# Patient Record
Sex: Male | Born: 1945 | Race: White | Hispanic: No | Marital: Married | Smoking: Former smoker
Health system: Southern US, Community
[De-identification: ages and names within clinical notes are randomized; demographics above are authoritative.]

## PROBLEM LIST (undated history)

## (undated) DIAGNOSIS — Z923 Personal history of irradiation: Secondary | ICD-10-CM

## (undated) DIAGNOSIS — G459 Transient cerebral ischemic attack, unspecified: Secondary | ICD-10-CM

## (undated) DIAGNOSIS — G709 Myoneural disorder, unspecified: Secondary | ICD-10-CM

## (undated) DIAGNOSIS — I493 Ventricular premature depolarization: Secondary | ICD-10-CM

## (undated) DIAGNOSIS — Z531 Procedure and treatment not carried out because of patient's decision for reasons of belief and group pressure: Secondary | ICD-10-CM

## (undated) DIAGNOSIS — K219 Gastro-esophageal reflux disease without esophagitis: Secondary | ICD-10-CM

## (undated) DIAGNOSIS — E785 Hyperlipidemia, unspecified: Secondary | ICD-10-CM

## (undated) DIAGNOSIS — F419 Anxiety disorder, unspecified: Secondary | ICD-10-CM

## (undated) DIAGNOSIS — IMO0001 Reserved for inherently not codable concepts without codable children: Secondary | ICD-10-CM

## (undated) DIAGNOSIS — I639 Cerebral infarction, unspecified: Secondary | ICD-10-CM

## (undated) DIAGNOSIS — R634 Abnormal weight loss: Secondary | ICD-10-CM

## (undated) DIAGNOSIS — J45909 Unspecified asthma, uncomplicated: Secondary | ICD-10-CM

## (undated) DIAGNOSIS — C719 Malignant neoplasm of brain, unspecified: Secondary | ICD-10-CM

## (undated) DIAGNOSIS — R569 Unspecified convulsions: Secondary | ICD-10-CM

## (undated) DIAGNOSIS — I82409 Acute embolism and thrombosis of unspecified deep veins of unspecified lower extremity: Secondary | ICD-10-CM

## (undated) DIAGNOSIS — Z0389 Encounter for observation for other suspected diseases and conditions ruled out: Secondary | ICD-10-CM

## (undated) DIAGNOSIS — I471 Supraventricular tachycardia, unspecified: Secondary | ICD-10-CM

## (undated) DIAGNOSIS — T7840XA Allergy, unspecified, initial encounter: Secondary | ICD-10-CM

## (undated) DIAGNOSIS — E079 Disorder of thyroid, unspecified: Secondary | ICD-10-CM

## (undated) DIAGNOSIS — I10 Essential (primary) hypertension: Secondary | ICD-10-CM

## (undated) HISTORY — DX: Unspecified asthma, uncomplicated: J45.909

## (undated) HISTORY — DX: Transient cerebral ischemic attack, unspecified: G45.9

## (undated) HISTORY — DX: Supraventricular tachycardia, unspecified: I47.10

## (undated) HISTORY — PX: THYROIDECTOMY, PARTIAL: SHX18

## (undated) HISTORY — PX: SHOULDER SURGERY: SHX246

## (undated) HISTORY — DX: Disorder of thyroid, unspecified: E07.9

## (undated) HISTORY — DX: Gastro-esophageal reflux disease without esophagitis: K21.9

## (undated) HISTORY — DX: Ventricular premature depolarization: I49.3

## (undated) HISTORY — DX: Acute embolism and thrombosis of unspecified deep veins of unspecified lower extremity: I82.409

## (undated) HISTORY — DX: Allergy, unspecified, initial encounter: T78.40XA

## (undated) HISTORY — DX: Hyperlipidemia, unspecified: E78.5

## (undated) HISTORY — DX: Abnormal weight loss: R63.4

## (undated) HISTORY — DX: Encounter for observation for other suspected diseases and conditions ruled out: Z03.89

## (undated) HISTORY — DX: Supraventricular tachycardia: I47.1

## (undated) HISTORY — DX: Essential (primary) hypertension: I10

---

## 1998-07-12 LAB — HM COLONOSCOPY

## 2000-07-12 DIAGNOSIS — I639 Cerebral infarction, unspecified: Secondary | ICD-10-CM

## 2000-07-12 HISTORY — DX: Cerebral infarction, unspecified: I63.9

## 2002-04-24 ENCOUNTER — Encounter: Admission: RE | Admit: 2002-04-24 | Discharge: 2002-04-24 | Payer: Self-pay

## 2003-06-27 ENCOUNTER — Encounter: Admission: RE | Admit: 2003-06-27 | Discharge: 2003-06-27 | Payer: Self-pay | Admitting: Otolaryngology

## 2006-01-06 ENCOUNTER — Emergency Department: Payer: Self-pay | Admitting: Unknown Physician Specialty

## 2008-07-12 LAB — HM COLONOSCOPY

## 2010-02-02 ENCOUNTER — Emergency Department: Payer: Self-pay | Admitting: Emergency Medicine

## 2010-04-14 ENCOUNTER — Ambulatory Visit: Payer: Self-pay | Admitting: Otolaryngology

## 2012-01-05 ENCOUNTER — Ambulatory Visit: Payer: Self-pay | Admitting: Endocrinology

## 2012-02-04 ENCOUNTER — Encounter: Payer: Self-pay | Admitting: Endocrinology

## 2012-02-04 ENCOUNTER — Ambulatory Visit (INDEPENDENT_AMBULATORY_CARE_PROVIDER_SITE_OTHER): Payer: Medicare Other | Admitting: Endocrinology

## 2012-02-04 ENCOUNTER — Other Ambulatory Visit (INDEPENDENT_AMBULATORY_CARE_PROVIDER_SITE_OTHER): Payer: Medicare Other

## 2012-02-04 VITALS — BP 110/72 | HR 69 | Temp 97.9°F | Ht 75.0 in | Wt 241.0 lb

## 2012-02-04 DIAGNOSIS — I10 Essential (primary) hypertension: Secondary | ICD-10-CM | POA: Insufficient documentation

## 2012-02-04 DIAGNOSIS — J45909 Unspecified asthma, uncomplicated: Secondary | ICD-10-CM | POA: Insufficient documentation

## 2012-02-04 DIAGNOSIS — E079 Disorder of thyroid, unspecified: Secondary | ICD-10-CM | POA: Insufficient documentation

## 2012-02-04 DIAGNOSIS — K219 Gastro-esophageal reflux disease without esophagitis: Secondary | ICD-10-CM | POA: Insufficient documentation

## 2012-02-04 LAB — TSH: TSH: 0.69 u[IU]/mL (ref 0.35–5.50)

## 2012-02-04 LAB — T4, FREE: Free T4: 0.79 ng/dL (ref 0.60–1.60)

## 2012-02-04 NOTE — Patient Instructions (Addendum)
blood tests are being requested for you today.  We'll call you with results.

## 2012-02-04 NOTE — Progress Notes (Signed)
Subjective:    Patient ID: Ricky Montgomery, male    DOB: 1946/05/14, 66 y.o.   MRN: [id number]  HPI Pt state few mos of slight headache throughout the head, and assoc severe fatigue.  He says he was found to have a severely underactive thyroid on labs.   Past Medical History  Diagnosis Date  . Asthma   . GERD (gastroesophageal reflux disease)   . Allergy   . PVC (premature ventricular contraction)   . Hypertension   . Thyroid disease    No past surgical history on file.  History   Social History  . Marital Status: Married    Spouse Name: N/A    Number of Children: N/A  . Years of Education: 11   Occupational History  . Retired     Music therapist   Social History Main Topics  . Smoking status: Former Smoker -- 0.5 packs/day for 5 years    Types: Cigarettes  . Smokeless tobacco: Never Used  . Alcohol Use: No  . Drug Use: No  . Sexually Active: Not on file   Other Topics Concern  . Not on file   Social History Narrative   Regular exercise-yesCaffeine Use-yes    Current Outpatient Prescriptions on File Prior to Visit  Medication Sig Dispense Refill  . hydrochlorothiazide (HYDRODIURIL) 25 MG tablet Take 25 mg by mouth daily.       [name] Kitchen losartan (COZAAR) 50 MG tablet Take 50 mg by mouth daily.       . metoprolol succinate (TOPROL-XL) 50 MG 24 hr tablet Take 50 mg by mouth daily.        No Known Allergies  Family History  Problem Relation Age of Onset  . Alcohol abuse Other     Grandparent  no thyroid probs. BP 110/72  Pulse 69  Temp 97.9 F (36.6 C) (Oral)  Ht 6\' 3"  (1.905 m)  Wt 241 lb (109.317 kg)  BMI 30.12 kg/m2  SpO2 97%  Review of Systems denies polyuria, depression, numbness, erectile dysfunction, weight change, gynecomastia, muscle weakness, fever, dysuria, easy bruising, rash, blurry vision, rhinorrhea, chest pain.  He attributes hoarseness to gerd, and doe to asthma.      Objective:   Physical Exam VS: see vs page GEN: no distress HEAD: head: no  deformity eyes: no periorbital swelling, no proptosis external nose and ears are normal mouth: no lesion seen NECK: supple, thyroid is not enlarged CHEST WALL: no deformity LUNGS: clear to auscultation BREASTS:  Slight right-sided gynecomastia.  No d/c. CV: reg rate and rhythm, no murmur ABD: abdomen is soft, nontender.  no hepatosplenomegaly.  not distended.  no hernia.   MUSCULOSKELETAL: muscle bulk and strength are grossly normal.  no obvious joint swelling.  gait is normal and steady EXTEMITIES: no deformity.  no ulcer on the feet.  feet are of normal color and temp.  1+ lef leg edema.  trace on the right leg. PULSES:  no carotid bruit NEURO:  cn 2-12 grossly intact.   readily moves all 4's.  sensation is intact to touch on the feet SKIN:  Normal texture and temperature.  No rash or suspicious lesion is visible.   NODES:  None palpable at the neck PSYCH: alert, oriented x3.  Does not appear anxious nor depressed.  Lab Results  Component Value Date   TSH 0.69 02/04/2012      Assessment & Plan:  Uncertain type of thyroid functional abnormality, resolved. Fatigue and other sxs, not thyroid-related. Asthma.  This  could contribute to sxs of fatigue. Gynecomastia, mild.  Often seen in this age group.  This should be rechecked in the future.

## 2012-02-06 ENCOUNTER — Encounter: Payer: Self-pay | Admitting: Endocrinology

## 2012-02-06 LAB — BASIC METABOLIC PANEL
Anion Gap: 11 (ref 7–16)
Calcium, Total: 9 mg/dL (ref 8.5–10.1)
Chloride: 104 mmol/L (ref 98–107)
Co2: 19 mmol/L — ABNORMAL LOW (ref 21–32)
Creatinine: 1.27 mg/dL (ref 0.60–1.30)
Potassium: 4.4 mmol/L (ref 3.5–5.1)

## 2012-02-06 LAB — CBC
HCT: 48 % (ref 40.0–52.0)
HGB: 16.9 g/dL (ref 13.0–18.0)
MCHC: 35.2 g/dL (ref 32.0–36.0)

## 2012-02-06 LAB — CK TOTAL AND CKMB (NOT AT ARMC): CK-MB: 3.7 ng/mL — ABNORMAL HIGH (ref 0.5–3.6)

## 2012-02-06 LAB — TSH: Thyroid Stimulating Horm: 1.95 u[IU]/mL

## 2012-02-07 ENCOUNTER — Observation Stay: Payer: Self-pay | Admitting: Internal Medicine

## 2012-02-07 LAB — BASIC METABOLIC PANEL
BUN: 35 mg/dL — ABNORMAL HIGH (ref 7–18)
Chloride: 104 mmol/L (ref 98–107)
Co2: 26 mmol/L (ref 21–32)
Creatinine: 1.11 mg/dL (ref 0.60–1.30)
Osmolality: 283 (ref 275–301)
Potassium: 5.4 mmol/L — ABNORMAL HIGH (ref 3.5–5.1)

## 2012-02-07 LAB — MAGNESIUM: Magnesium: 2.2 mg/dL

## 2012-02-07 LAB — CK TOTAL AND CKMB (NOT AT ARMC)
CK, Total: 124 U/L (ref 35–232)
CK, Total: 109 U/L (ref 35–232)

## 2012-02-07 LAB — HEMOGLOBIN A1C: Hemoglobin A1C: 6.5 % — ABNORMAL HIGH (ref 4.2–6.3)

## 2012-02-07 LAB — WBC: WBC: 13.6 10*3/uL — ABNORMAL HIGH (ref 3.8–10.6)

## 2012-02-07 LAB — TROPONIN I
Troponin-I: 0.07 ng/mL — ABNORMAL HIGH
Troponin-I: 0.06 ng/mL — ABNORMAL HIGH
Troponin-I: 0.05 ng/mL
Troponin-I: 0.04 ng/mL

## 2012-03-22 ENCOUNTER — Ambulatory Visit (INDEPENDENT_AMBULATORY_CARE_PROVIDER_SITE_OTHER): Payer: Medicare Other | Admitting: Internal Medicine

## 2012-03-22 ENCOUNTER — Encounter: Payer: Self-pay | Admitting: *Deleted

## 2012-03-22 ENCOUNTER — Institutional Professional Consult (permissible substitution): Payer: Medicare Other | Admitting: Internal Medicine

## 2012-03-22 ENCOUNTER — Encounter: Payer: Self-pay | Admitting: Internal Medicine

## 2012-03-22 VITALS — BP 140/76 | HR 77 | Resp 18 | Ht 74.0 in | Wt 239.0 lb

## 2012-03-22 DIAGNOSIS — I498 Other specified cardiac arrhythmias: Secondary | ICD-10-CM

## 2012-03-22 DIAGNOSIS — I10 Essential (primary) hypertension: Secondary | ICD-10-CM

## 2012-03-22 DIAGNOSIS — R0600 Dyspnea, unspecified: Secondary | ICD-10-CM | POA: Insufficient documentation

## 2012-03-22 DIAGNOSIS — I471 Supraventricular tachycardia: Secondary | ICD-10-CM | POA: Insufficient documentation

## 2012-03-22 NOTE — Patient Instructions (Addendum)
Your physician has recommended that you have an ablation. Catheter ablation is a medical procedure used to treat some cardiac arrhythmias (irregular heartbeats). During catheter ablation, a long, thin, flexible tube is put into a blood vessel in your groin (upper thigh), or neck. This tube is called an ablation catheter. It is then guided to your heart through the blood vessel. Radio frequency waves destroy small areas of heart tissue where abnormal heartbeats may cause an arrhythmia to start. Please see the instruction sheet given to you today.  Your physician recommends that you continue on your current medications as directed. Please refer to the Current Medication list given to you today.  Your physician recommends that you return for lab work in: 3 weeks for lab work prior to your ablation.

## 2012-03-22 NOTE — Progress Notes (Signed)
Primary Care Physician: Marin Comment, FNP Referring Physician:  Dr Sherley Bounds is a 66 y.o. male with a h/o recently diagnosed SVT who presents today for EP consultation.  He reports having occasional PVCs for about 1 year which he describes as a "skipped beat".  He did not find these very worrisome and has been chronically metprolol. Over the past 6 weeks however, he has developed abrupt onset/offset tachypalpitations for which he is highly symptomatic.  He states that the first episode occurred in July while cutting wood.  He developed abrupt tachypalpitations with associated chest pain and abrupt loss of energy/exercise tolerance.  This episode lasted about 5 minutes and terminated with deep breathing.  The second episode occurred July 28th later and occurred while painting his kitchen.  He again developed tachypalpitations with abrupt loss of exercise tolerance.  He developed chest pain and dizziness.  He sat down.  This episode lasted for about an hour.  He presented to [facility] and was documented to have a short RP narrow complex tachycardia at 170 bpm.  He was able to terminate the episode with a vagal maneuver.  He had a low risk echo and was discharged with a 24 hour holter which did not reveal further tachycardia.  His metoprolol was increased, however he did not tolerate this due to symtomatic hypotension.  He reports having another episode of tachycardia 1 week later which he was able to terminate with vagal maneuvers.  He has done reasonably well since that time. He has changed his primary cardiology care to Dr Allyson Sabal at [facility].  He is now referred to EP for further evaluation/ management of his SVT.  Today, he denies symptoms of chest pain, orthopnea, PND, lower extremity edema, dizziness, presyncope, syncope, or neurologic sequela.  He has episodic shortness of breathing for which he recently had an abnormal stress test.  He is planned for a cath next week.  The patient is tolerating  medications without difficulties and is otherwise without complaint today.   Past Medical History  Diagnosis Date  . Asthma   . GERD (gastroesophageal reflux disease)   . PVC (premature ventricular contraction)   . Thyroid disease   . Allergy   . Hypertension   . DVT (deep venous thrombosis)     L leg 30 years ago  . TIA (transient ischemic attack) 4 years ago  . SVT (supraventricular tachycardia)    Past Surgical History  Procedure Date  . Thyroidectomy, partial 1946    Current Outpatient Prescriptions  Medication Sig Dispense Refill  . hydrochlorothiazide (HYDRODIURIL) 25 MG tablet Take 25 mg by mouth daily.       [name] Kitchen losartan (COZAAR) 50 MG tablet Take 50 mg by mouth daily.       . metoprolol succinate (TOPROL-XL) 50 MG 24 hr tablet Take 50 mg by mouth daily.         No Known Allergies  History   Social History  . Marital Status: Married    Spouse Name: N/A    Number of Children: N/A  . Years of Education: 11   Occupational History  . Retired     Music therapist   Social History Main Topics  . Smoking status: Former Smoker -- 0.5 packs/day for 5 years    Types: Cigarettes  . Smokeless tobacco: Never Used   Comment: smoked form age [age], none since  . Alcohol Use: No  . Drug Use: No  . Sexually Active: Not on file  Other Topics Concern  . Not on file   Social History Narrative   Pt lives in [place] with spouse.Retired Microbiologist business    Family History  Problem Relation Age of Onset  . Alcohol abuse Other     Grandparent    ROS- All systems are reviewed and negative except as per the HPI above  Physical Exam: Filed Vitals:   03/22/12 0934  BP: 140/76  Pulse: 77  Resp: 18  Height: 6\' 2"  (1.88 m)  Weight: 239 lb (108.41 kg)  SpO2: 98%    GEN- The patient is well appearing, alert and oriented x 3 today.   Head- normocephalic, atraumatic Eyes-  Sclera clear, conjunctiva pink Ears- hearing intact Oropharynx- clear Neck- supple, no  JVP Lymph- no cervical lymphadenopathy Lungs- Clear to ausculation bilaterally, normal work of breathing Heart- Regular rate and rhythm, no murmurs, rubs or gallops, PMI not laterally displaced GI- soft, NT, ND, + BS Extremities- no clubbing, cyanosis, or edema MS- no significant deformity or atrophy Skin- no rash or lesion Psych- euthymic mood, full affect Neuro- strength and sensation are intact  EKG today reveals sinus rhythm 58 bpm, PR 142, QRS 112, Qtc 398, LVH, LAD Echo 02/07/12- EF >55%, mild left atrial dilatation,  holter 02/07/12- sinus with occasional PVCs, no SVT EKG 02/07/12- short RP tachycardia 172 bpm Assessment and Plan:  1. SVT- The patient has well documented symptomatic short RP tachycardia.  Episodes have previously terminated with valsalva maneuvers.  He has failed medical therapy with metoprolol.  Therapeutic strategies for supraventricular tachycardia including medicine and ablation were discussed in detail with the patient today. Risk, benefits, and alternatives to EP study and radiofrequency ablation were also discussed in detail today. These risks include but are not limited to stroke, bleeding, vascular damage, tamponade, perforation, damage to the heart and other structures, AV block requiring pacemaker, worsening renal function, and death. The patient understands these risk and wishes to proceed.  We will therefore proceed with catheter ablation at the next available time.  2. SOB- The patient has dypsnea of unclear etiology.  I have spoken with Dr Allyson Sabal today who plans left heart cath next week.  We will defer ablation until the results of his Cath are known.  3. HTN- stable No changes today

## 2012-03-23 ENCOUNTER — Other Ambulatory Visit: Payer: Self-pay | Admitting: Cardiovascular Disease

## 2012-03-23 ENCOUNTER — Ambulatory Visit
Admission: RE | Admit: 2012-03-23 | Discharge: 2012-03-23 | Disposition: A | Discharge status: Home / Self Care | Payer: Medicare Other | Source: Ambulatory Visit | Attending: Cardiovascular Disease | Admitting: Cardiovascular Disease

## 2012-03-23 ENCOUNTER — Encounter (HOSPITAL_COMMUNITY): Payer: Self-pay | Admitting: Pharmacy Technician

## 2012-03-23 DIAGNOSIS — Z01811 Encounter for preprocedural respiratory examination: Secondary | ICD-10-CM

## 2012-03-23 DIAGNOSIS — R0602 Shortness of breath: Secondary | ICD-10-CM

## 2012-03-24 ENCOUNTER — Telehealth: Payer: Self-pay | Admitting: Internal Medicine

## 2012-03-24 NOTE — Telephone Encounter (Signed)
Pt calling re cardiologist is now Ricky Montgomery at [facility] heart and vascular

## 2012-03-24 NOTE — Telephone Encounter (Signed)
Spoke with Ricky Montgomery, he is having a cath by dr berry on wed next week. Dr Johney Frame is aware. The Ricky Montgomery was calling because his paperwork from the hosp shows a different cardiologist and he wants to make sure we are aware of the change.

## 2012-03-28 ENCOUNTER — Encounter: Payer: Self-pay | Admitting: Internal Medicine

## 2012-03-29 ENCOUNTER — Ambulatory Visit (HOSPITAL_COMMUNITY): Admission: RE | Admit: 2012-03-29 | Payer: Medicare Other | Source: Ambulatory Visit | Admitting: Cardiovascular Disease

## 2012-03-29 ENCOUNTER — Encounter (HOSPITAL_COMMUNITY): Admission: RE | Payer: Self-pay | Source: Ambulatory Visit

## 2012-03-29 SURGERY — LEFT HEART CATHETERIZATION WITH CORONARY ANGIOGRAM
Anesthesia: LOCAL

## 2012-04-06 ENCOUNTER — Other Ambulatory Visit: Payer: Self-pay | Admitting: Cardiovascular Disease

## 2012-04-07 ENCOUNTER — Encounter (HOSPITAL_COMMUNITY): Payer: Self-pay

## 2012-04-11 ENCOUNTER — Other Ambulatory Visit: Payer: Self-pay | Admitting: Cardiovascular Disease

## 2012-04-11 DIAGNOSIS — IMO0001 Reserved for inherently not codable concepts without codable children: Secondary | ICD-10-CM

## 2012-04-11 HISTORY — DX: Reserved for inherently not codable concepts without codable children: IMO0001

## 2012-04-14 ENCOUNTER — Encounter (HOSPITAL_COMMUNITY)
Admission: RE | Disposition: A | Discharge status: Home / Self Care | Payer: Self-pay | Source: Ambulatory Visit | Attending: Cardiovascular Disease

## 2012-04-14 ENCOUNTER — Ambulatory Visit (HOSPITAL_COMMUNITY)
Admission: RE | Admit: 2012-04-14 | Discharge: 2012-04-14 | Disposition: A | Discharge status: Home / Self Care | Payer: Medicare Other | Source: Ambulatory Visit | Attending: Cardiovascular Disease | Admitting: Cardiovascular Disease

## 2012-04-14 DIAGNOSIS — R0609 Other forms of dyspnea: Secondary | ICD-10-CM | POA: Insufficient documentation

## 2012-04-14 DIAGNOSIS — I471 Supraventricular tachycardia, unspecified: Secondary | ICD-10-CM | POA: Insufficient documentation

## 2012-04-14 DIAGNOSIS — R079 Chest pain, unspecified: Secondary | ICD-10-CM | POA: Insufficient documentation

## 2012-04-14 DIAGNOSIS — R9439 Abnormal result of other cardiovascular function study: Secondary | ICD-10-CM | POA: Insufficient documentation

## 2012-04-14 DIAGNOSIS — R0989 Other specified symptoms and signs involving the circulatory and respiratory systems: Secondary | ICD-10-CM | POA: Insufficient documentation

## 2012-04-14 HISTORY — PX: LEFT HEART CATHETERIZATION WITH CORONARY ANGIOGRAM: SHX5451

## 2012-04-14 LAB — CBC
HCT: 48.4 % (ref 39.0–52.0)
Platelets: 227 10*3/uL (ref 150–400)
RDW: 13.1 % (ref 11.5–15.5)
WBC: 6.4 10*3/uL (ref 4.0–10.5)

## 2012-04-14 LAB — BASIC METABOLIC PANEL
BUN: 24 mg/dL — ABNORMAL HIGH (ref 6–23)
Chloride: 100 mEq/L (ref 96–112)
GFR calc Af Amer: 82 mL/min — ABNORMAL LOW (ref >90)
Potassium: 4.1 mEq/L (ref 3.5–5.1)
Sodium: 136 mEq/L (ref 135–145)

## 2012-04-14 LAB — PROTIME-INR
INR: 1 (ref 0.00–1.49)
Prothrombin Time: 13.1 seconds (ref 11.6–15.2)

## 2012-04-14 LAB — NO BLOOD PRODUCTS

## 2012-04-14 SURGERY — LEFT HEART CATHETERIZATION WITH CORONARY ANGIOGRAM
Anesthesia: LOCAL

## 2012-04-14 MED ORDER — MORPHINE SULFATE 4 MG/ML IJ SOLN: 1.0000 mg | INTRAMUSCULAR | Status: DC | PRN

## 2012-04-14 MED ORDER — LIDOCAINE HCL (PF) 1 % IJ SOLN
INTRAMUSCULAR | Status: AC
  Filled 2012-04-14: qty 30

## 2012-04-14 MED ORDER — NITROGLYCERIN 0.2 MG/ML ON CALL CATH LAB
INTRAVENOUS | Status: AC
  Filled 2012-04-14: qty 1

## 2012-04-14 MED ORDER — VERAPAMIL HCL 2.5 MG/ML IV SOLN
INTRAVENOUS | Status: AC
  Filled 2012-04-14: qty 2

## 2012-04-14 MED ORDER — DIAZEPAM 5 MG PO TABS
ORAL_TABLET | ORAL | Status: AC
  Administered 2012-04-14: 5 mg via ORAL
  Filled 2012-04-14: qty 1

## 2012-04-14 MED ORDER — FENTANYL CITRATE 0.05 MG/ML IJ SOLN
INTRAMUSCULAR | Status: AC
  Filled 2012-04-14: qty 2

## 2012-04-14 MED ORDER — DIAZEPAM 5 MG PO TABS
5.0000 mg | ORAL_TABLET | ORAL | Status: AC
  Administered 2012-04-14: 5 mg via ORAL

## 2012-04-14 MED ORDER — ACETAMINOPHEN 325 MG PO TABS: 650.0000 mg | ORAL_TABLET | ORAL | Status: DC | PRN

## 2012-04-14 MED ORDER — SODIUM CHLORIDE 0.9 % IV SOLN
INTRAVENOUS | Status: DC
  Administered 2012-04-14: 12:00:00 via INTRAVENOUS

## 2012-04-14 MED ORDER — ONDANSETRON HCL 4 MG/2ML IJ SOLN: 4.0000 mg | Freq: Four times a day (QID) | INTRAMUSCULAR | Status: DC | PRN

## 2012-04-14 MED ORDER — MIDAZOLAM HCL 2 MG/2ML IJ SOLN
INTRAMUSCULAR | Status: AC
  Filled 2012-04-14: qty 2

## 2012-04-14 MED ORDER — SODIUM CHLORIDE 0.9 % IV SOLN: INTRAVENOUS | Status: DC

## 2012-04-14 MED ORDER — HEPARIN SODIUM (PORCINE) 1000 UNIT/ML IJ SOLN
INTRAMUSCULAR | Status: AC
  Filled 2012-04-14: qty 1

## 2012-04-14 MED ORDER — HEPARIN (PORCINE) IN NACL 2-0.9 UNIT/ML-% IJ SOLN
INTRAMUSCULAR | Status: AC
  Filled 2012-04-14: qty 1000

## 2012-04-14 MED ORDER — SODIUM CHLORIDE 0.9 % IJ SOLN: 3.0000 mL | INTRAMUSCULAR | Status: DC | PRN

## 2012-04-14 NOTE — H&P (Signed)
  H & P will be scanned in.  Pt was reexamined and existing H & P reviewed. No changes found.  Runell Gess, MD [facility] 04/14/2012 2:39 PM

## 2012-04-14 NOTE — Op Note (Signed)
Ricky Montgomery is a 66 y.o. male    [id number] LOCATION:  FACILITY: [facility]  PHYSICIAN: Nanetta Batty, M.D. 06-02-1946   DATE OF PROCEDURE:  04/14/2012  DATE OF DISCHARGE:  [facility]  CARDIAC CATHETERIZATION     History obtained from chart review.66 year old gentleman with history of PSVT, dyspnea and chest pain. The factors notable for hypertension hypokalemia. A positive stress echo in [place]. Positive MET test in our office. He scheduled for ablation by Dr. Hillis Range. Cardiac catheterization performed pull out an ischemic etiology.   PROCEDURE DESCRIPTION:    The patient was brought to the second floor  [place] Cardiac cath lab in the postabsorptive state. He was  premedicated with Valium 5 mg by mouth, IV Versed and fentanyl. His right wristwas prepped and shaved in usual sterile fashion. Xylocaine 1% was used  for local anesthesia. A 5 French sheath was inserted into the right radial  artery using standard Seldinger technique. The patient received  5000 units  of heparin  intravenously.  A 5 [place] JR 4 catheter along with a 5 [place] EBU 3 Dr. Catheter and pigtail catheters were used for selective coronary angiography and left ventriculography respectively. Visipaque dye was used for the entirety of the case. Retrograde aortic, ventricular and pullback pressures were recorded.    HEMODYNAMICS:    AO SYSTOLIC/AO DIASTOLIC: 132/65   LV SYSTOLIC/LV DIASTOLIC: 130/5  ANGIOGRAPHIC RESULTS:   1. Left main; normal  2. LAD; normal 3. Left circumflex; normal.  4. Right coronary artery; dominant and normal 5. Left ventriculography; RAO left ventriculogram was performed using  25 mL of Visipaque dye at 12 mL/second. The overall LVEF estimated  60 % Without wall motion abnormalities  IMPRESSION:[name] has normal coronary arteries and normal left ventricular function. I believe his chest pain is related to PSVT, and his MAT test is false  positive. He is cleared to proceed with ablation with Dr. Johney Frame. The sheath was removed and a T-R band was placed in the right wrist which is patent hemostasis. The patient left the Cath Lab in stable condition. Gently hydrated, discharged home later today as an outpatient I will see me back in the office one to 2 weeks.  Runell Gess MD, [facility] 04/14/2012 3:29 PM

## 2012-04-18 ENCOUNTER — Encounter (HOSPITAL_COMMUNITY): Payer: Self-pay | Admitting: Pharmacy Technician

## 2012-04-19 ENCOUNTER — Other Ambulatory Visit (INDEPENDENT_AMBULATORY_CARE_PROVIDER_SITE_OTHER): Payer: Medicare Other

## 2012-04-19 DIAGNOSIS — I498 Other specified cardiac arrhythmias: Secondary | ICD-10-CM

## 2012-04-19 DIAGNOSIS — I471 Supraventricular tachycardia: Secondary | ICD-10-CM

## 2012-04-19 LAB — CBC WITH DIFFERENTIAL/PLATELET
Basophils Relative: 0.4 % (ref 0.0–3.0)
Eosinophils Relative: 1 % (ref 0.0–5.0)
Hemoglobin: 15.5 g/dL (ref 13.0–17.0)
Lymphocytes Relative: 24.7 % (ref 12.0–46.0)
MCHC: 32.7 g/dL (ref 30.0–36.0)
Monocytes Relative: 10.5 % (ref 3.0–12.0)
Neutro Abs: 4.3 10*3/uL (ref 1.4–7.7)
RBC: 5.01 Mil/uL (ref 4.22–5.81)
WBC: 6.8 10*3/uL (ref 4.5–10.5)

## 2012-04-19 LAB — BASIC METABOLIC PANEL
BUN: 20 mg/dL (ref 6–23)
CO2: 28 mEq/L (ref 19–32)
Chloride: 102 mEq/L (ref 96–112)
Creatinine, Ser: 1.1 mg/dL (ref 0.4–1.5)
Glucose, Bld: 96 mg/dL (ref 70–99)

## 2012-04-27 ENCOUNTER — Encounter (HOSPITAL_COMMUNITY)
Admission: RE | Disposition: A | Discharge status: Home / Self Care | Payer: Self-pay | Source: Ambulatory Visit | Attending: Internal Medicine

## 2012-04-27 ENCOUNTER — Ambulatory Visit (HOSPITAL_COMMUNITY)
Admission: RE | Admit: 2012-04-27 | Discharge: 2012-04-28 | Disposition: A | Discharge status: Home / Self Care | Payer: Medicare Other | Source: Ambulatory Visit | Attending: Internal Medicine | Admitting: Internal Medicine

## 2012-04-27 ENCOUNTER — Encounter (HOSPITAL_COMMUNITY): Payer: Self-pay | Admitting: General Practice

## 2012-04-27 DIAGNOSIS — Z86718 Personal history of other venous thrombosis and embolism: Secondary | ICD-10-CM | POA: Insufficient documentation

## 2012-04-27 DIAGNOSIS — Z79899 Other long term (current) drug therapy: Secondary | ICD-10-CM | POA: Insufficient documentation

## 2012-04-27 DIAGNOSIS — Z8673 Personal history of transient ischemic attack (TIA), and cerebral infarction without residual deficits: Secondary | ICD-10-CM | POA: Insufficient documentation

## 2012-04-27 DIAGNOSIS — I471 Supraventricular tachycardia, unspecified: Secondary | ICD-10-CM | POA: Diagnosis present

## 2012-04-27 DIAGNOSIS — I4949 Other premature depolarization: Secondary | ICD-10-CM | POA: Insufficient documentation

## 2012-04-27 DIAGNOSIS — K219 Gastro-esophageal reflux disease without esophagitis: Secondary | ICD-10-CM | POA: Insufficient documentation

## 2012-04-27 DIAGNOSIS — E079 Disorder of thyroid, unspecified: Secondary | ICD-10-CM | POA: Insufficient documentation

## 2012-04-27 DIAGNOSIS — J45909 Unspecified asthma, uncomplicated: Secondary | ICD-10-CM | POA: Insufficient documentation

## 2012-04-27 DIAGNOSIS — I10 Essential (primary) hypertension: Secondary | ICD-10-CM | POA: Insufficient documentation

## 2012-04-27 HISTORY — DX: Reserved for inherently not codable concepts without codable children: IMO0001

## 2012-04-27 HISTORY — DX: Procedure and treatment not carried out because of patient's decision for reasons of belief and group pressure: Z53.1

## 2012-04-27 HISTORY — DX: Unspecified convulsions: R56.9

## 2012-04-27 HISTORY — DX: Cerebral infarction, unspecified: I63.9

## 2012-04-27 HISTORY — PX: ABLATION OF DYSRHYTHMIC FOCUS: SHX254

## 2012-04-27 HISTORY — PX: SUPRAVENTRICULAR TACHYCARDIA ABLATION: SHX5492

## 2012-04-27 SURGERY — SUPRAVENTRICULAR TACHYCARDIA ABLATION
Anesthesia: LOCAL

## 2012-04-27 MED ORDER — HYDROCODONE-ACETAMINOPHEN 5-325 MG PO TABS: 1.0000 | ORAL_TABLET | ORAL | Status: DC | PRN

## 2012-04-27 MED ORDER — ONDANSETRON HCL 4 MG/2ML IJ SOLN: 4.0000 mg | Freq: Four times a day (QID) | INTRAMUSCULAR | Status: DC | PRN

## 2012-04-27 MED ORDER — FENTANYL CITRATE 0.05 MG/ML IJ SOLN
INTRAMUSCULAR | Status: AC
  Filled 2012-04-27: qty 2

## 2012-04-27 MED ORDER — MIDAZOLAM HCL 5 MG/5ML IJ SOLN
INTRAMUSCULAR | Status: AC
  Filled 2012-04-27: qty 5

## 2012-04-27 MED ORDER — SODIUM CHLORIDE 0.9 % IJ SOLN: 3.0000 mL | INTRAMUSCULAR | Status: DC | PRN

## 2012-04-27 MED ORDER — ACETAMINOPHEN 325 MG PO TABS: 650.0000 mg | ORAL_TABLET | ORAL | Status: DC | PRN

## 2012-04-27 MED ORDER — ASPIRIN EC 81 MG PO TBEC
81.0000 mg | DELAYED_RELEASE_TABLET | Freq: Every day | ORAL | Status: DC
  Filled 2012-04-27 (×2): qty 1

## 2012-04-27 MED ORDER — HYDROXYUREA 500 MG PO CAPS
ORAL_CAPSULE | ORAL | Status: AC
  Filled 2012-04-27: qty 1

## 2012-04-27 MED ORDER — SODIUM CHLORIDE 0.9 % IV SOLN: 250.0000 mL | INTRAVENOUS | Status: DC | PRN

## 2012-04-27 MED ORDER — ZOLPIDEM TARTRATE 5 MG PO TABS
5.0000 mg | ORAL_TABLET | Freq: Every day | ORAL | Status: DC
  Administered 2012-04-27: 5 mg via ORAL
  Filled 2012-04-27: qty 1

## 2012-04-27 MED ORDER — BUPIVACAINE HCL (PF) 0.25 % IJ SOLN
INTRAMUSCULAR | Status: AC
  Filled 2012-04-27: qty 60

## 2012-04-27 MED ORDER — LOSARTAN POTASSIUM 50 MG PO TABS
50.0000 mg | ORAL_TABLET | Freq: Every day | ORAL | Status: DC
  Administered 2012-04-27: 21:00:00 50 mg via ORAL
  Filled 2012-04-27 (×2): qty 1

## 2012-04-27 MED ORDER — ATORVASTATIN CALCIUM 20 MG PO TABS
20.0000 mg | ORAL_TABLET | Freq: Every day | ORAL | Status: DC
  Administered 2012-04-27: 20 mg via ORAL
  Filled 2012-04-27 (×2): qty 1

## 2012-04-27 MED ORDER — SODIUM CHLORIDE 0.9 % IJ SOLN
3.0000 mL | Freq: Two times a day (BID) | INTRAMUSCULAR | Status: DC
  Administered 2012-04-27: 3 mL via INTRAVENOUS

## 2012-04-27 MED ORDER — CYCLOBENZAPRINE HCL 10 MG PO TABS: 10.0000 mg | ORAL_TABLET | Freq: Three times a day (TID) | ORAL | Status: DC | PRN

## 2012-04-27 NOTE — H&P (Signed)
Primary Care Physician: Marin Comment, FNP  Referring Physician: Dr Sherley Bounds is a 66 y.o. male with a h/o recently diagnosed SVT who presents today for EP Study and ablation. He reports having occasional PVCs for about 1 year which he describes as a "skipped beat". He did not find these very worrisome and has been chronically metprolol.  Over the past few weeks however, he has developed abrupt onset/offset tachypalpitations for which he is highly symptomatic. He states that the first episode occurred in July while cutting wood. He developed abrupt tachypalpitations with associated chest pain and abrupt loss of energy/exercise tolerance. This episode lasted about 5 minutes and terminated with deep breathing. The second episode occurred July 28th later and occurred while painting his kitchen. He again developed tachypalpitations with abrupt loss of exercise tolerance. He developed chest pain and dizziness. He sat down. This episode lasted for about an hour. He presented to [facility] and was documented to have a short RP narrow complex tachycardia at 170 bpm. He was able to terminate the episode with a vagal maneuver. He had a low risk echo and was discharged with a 24 hour holter which did not reveal further tachycardia. His metoprolol was increased, however he did not tolerate this due to symtomatic hypotension. He reports having another episode of tachycardia 1 week later which he was able to terminate with vagal maneuvers. He has done reasonably well since that time.  He has changed his primary cardiology care to Dr Allyson Sabal at [facility]. He underwent recent cath which revealed no CAD. Today, he denies symptoms of chest pain, orthopnea, PND, lower extremity edema, dizziness, presyncope, syncope, or neurologic sequela. He has episodic shortness of breathing for which he recently had an abnormal stress test. He is planned for a cath next week. The patient is tolerating medications without difficulties and  is otherwise without complaint today.   Past Medical History   Diagnosis  Date   .  Asthma    .  GERD (gastroesophageal reflux disease)    .  PVC (premature ventricular contraction)    .  Thyroid disease    .  Allergy    .  Hypertension    .  DVT (deep venous thrombosis)      L leg 30 years ago   .  TIA (transient ischemic attack)  4 years ago   .  SVT (supraventricular tachycardia)     Past Surgical History   Procedure  Date   .  Thyroidectomy, partial  1946    Current Outpatient Prescriptions   Medication  Sig  Dispense  Refill   .  hydrochlorothiazide (HYDRODIURIL) 25 MG tablet  Take 25 mg by mouth daily.     [name] Kitchen  losartan (COZAAR) 50 MG tablet  Take 50 mg by mouth daily.     .  metoprolol succinate (TOPROL-XL) 50 MG 24 hr tablet  Take 50 mg by mouth daily.      No Known Allergies  History    Social History   .  Marital Status:  Married     Spouse Name:  N/A     Number of Children:  N/A   .  Years of Education:  11    Occupational History   .  Retired      Music therapist    Social History Main Topics   .  Smoking status:  Former Smoker -- 0.5 packs/day for 5 years     Types:  Cigarettes   .  Smokeless tobacco:  Never Used     Comment: smoked form age [age], none since    .  Alcohol Use:  No   .  Drug Use:  No   .  Sexually Active:  Not on file    Other Topics  Concern   .  Not on file    Social History Narrative    Pt lives in [place] with spouse.Retired Microbiologist business    Family History   Problem  Relation  Age of Onset   .  Alcohol abuse  Other       Grandparent    I above  Physical Exam:  Filed Vitals:    03/22/12 0934   BP:  140/76   Pulse:  77   Resp:  18   Height:  6\' 2"  (1.88 m)   Weight:  239 lb (108.41 kg)   SpO2:  98%    GEN- The patient is well appearing, alert and oriented x 3 today.  Head- normocephalic, atraumatic  Eyes- Sclera clear, conjunctiva pink  Ears- hearing intact  Oropharynx- clear  Neck- supple, no JVP    Lymph- no cervical lymphadenopathy  Lungs- Clear to ausculation bilaterally, normal work of breathing  Heart- Regular rate and rhythm, no murmurs, rubs or gallops, PMI not laterally displaced  GI- soft, NT, ND, + BS  Extremities- no clubbing, cyanosis, or edema  MS- no significant deformity or atrophy  Skin- no rash or lesion  Psych- euthymic mood, full affect  Neuro- strength and sensation are intact   EKG 9/13 reveals sinus rhythm 58 bpm, PR 142, QRS 112, Qtc 398, LVH, LAD  Echo 02/07/12- EF >55%, mild left atrial dilatation,  holter 02/07/12- sinus with occasional PVCs, no SVT  EKG 02/07/12- short RP tachycardia 172 bpm   Assessment and Plan:  1. SVT- The patient has well documented symptomatic short RP tachycardia. Episodes have previously terminated with valsalva maneuvers. He has failed medical therapy with metoprolol. Therapeutic strategies for supraventricular tachycardia including medicine and ablation were discussed in detail with the patient today. Risk, benefits, and alternatives to EP study and radiofrequency ablation were also discussed in detail today. These risks include but are not limited to stroke, bleeding, vascular damage, tamponade, perforation, damage to the heart and other structures, AV block requiring pacemaker, worsening renal function, and death. The patient understands these risk and wishes to proceed. We will therefore proceed with catheter ablation at this time.

## 2012-04-27 NOTE — Brief Op Note (Signed)
04/27/2012  12:17 PM  PATIENT:  Ricky Montgomery  66 y.o. male  PRE-OPERATIVE DIAGNOSIS:  svt  POST-OPERATIVE DIAGNOSIS:  AVNRT  PROCEDURE:  Procedure(s) (LRB) with comments: SUPRAVENTRICULAR TACHYCARDIA ABLATION (N/A)  SURGEON:  Surgeon(s) and Role:    * Hillis Range, MD - Primary  PHYSICIAN ASSISTANT:   ASSISTANTS: none   ANESTHESIA:   IV sedation  EBL:     BLOOD ADMINISTERED:none  DRAINS: none   LOCAL MEDICATIONS USED:  LIDOCAINE   SPECIMEN:  No Specimen  DISPOSITION OF SPECIMEN:  N/A  COUNTS:  YES  TOURNIQUET:  * No tourniquets in log *  DICTATION: .Other Dictation: Dictation Number [id number]  PLAN OF CARE: outpatient  PATIENT DISPOSITION:  PACU - hemodynamically stable.   Delay start of Pharmacological VTE agent (>24hrs) due to surgical blood loss or risk of bleeding: not applicable

## 2012-04-28 DIAGNOSIS — I471 Supraventricular tachycardia: Secondary | ICD-10-CM

## 2012-04-28 MED ORDER — METOPROLOL SUCCINATE ER 25 MG PO TB24: ORAL_TABLET | ORAL | Status: DC

## 2012-04-28 MED ORDER — OFF THE BEAT BOOK
Freq: Once | Status: AC
  Administered 2012-04-28: 11:00:00
  Filled 2012-04-28: qty 1

## 2012-04-28 NOTE — Discharge Summary (Signed)
ELECTROPHYSIOLOGY PROCEDURE DISCHARGE SUMMARY    Patient ID: Ricky Montgomery,  MRN: [id number], DOB/AGE: February 06, 1946 66 y.o.  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  Asthma 2.  GERD 3.  Chest pain- status post catheterization 04-14-2012 with normal coronary arteries 4.  TIA 5.  DVT  6.  Thyroid disease s/p partial thyroidectomy  Procedures This Admission:  1.  Electrophysiology study and radiofrequency catheter ablation of supraventricular tachycardia on 04-27-2012 by Dr Johney Frame.  This study demonstrated sinus rhythm upon presentation, dual AV nodal physiology with successful slow pathway modification undertaken.  There were no early apparent complications.   Brief HPI: Ricky Montgomery is a 66 y.o. male with a h/o recently diagnosed SVT who was referred in the outpatient setting for EP consultation. He reports having occasional PVCs for about 1 year which he describes as a "skipped beat". He did not find these very worrisome and has been chronically metprolol.  Over the past 6 weeks however, he has developed abrupt onset/offset tachypalpitations for which he is highly symptomatic. He states that the first episode occurred in July while cutting wood. He developed abrupt tachypalpitations with associated chest pain and abrupt loss of energy/exercise tolerance. This episode lasted about 5 minutes and terminated with deep breathing. The second episode occurred July 28th later and occurred while painting his kitchen. He again developed tachypalpitations with abrupt loss of exercise tolerance. He developed chest pain and dizziness. He sat down. This episode lasted for about an hour. He presented to [facility] and was documented to have a short RP narrow complex  tachycardia at 170 bpm. He was able to terminate the episode with a vagal maneuver. He had a low risk echo and was discharged with a 24 hour holter which did not reveal further tachycardia. His metoprolol was increased, however he did not tolerate this due to symtomatic hypotension. He reports having another episode of tachycardia 1 week later which he was able to terminate with vagal maneuvers. He has done reasonably well since that time. He has changed his primary cardiology care to Dr Allyson Sabal at [facility]. He is now referred to EP for further evaluation/ management of his SVT.  Because of shortness of breath and chest pain, he underwent catheterization on 04-14-2012 by Dr Allyson Sabal which demonstrated normal coronary arteries.  Risks, benefits, and alternatives to ablation were reviewed with the patient who wished to proceed  Hospital Course:  The patient was admitted on 04-27-2012 and underwent successful ablation of AVNRT with details as outlined above.  He was monitored on telemetry overnight which demonstrated sinus rhythm with rare PVC's.  His groin incision was without hematoma or bruit.  Dr Johney Frame examined the patient and considered him stable for discharge to home with plans to take 1/2 Metoprolol for 1 week then discontinue.  Follow up was scheduled in 4 weeks with Dr Johney Frame.   Discharge Vitals: Blood pressure 133/66, pulse 68, temperature 97.7 F (36.5 C), temperature source Oral, resp. rate 15, height 6\' 2"  (1.88 m), weight 241 lb 14.4 oz (109.725 kg), SpO2 97.00%.   Labs:  No labs this admission  Discharge Medications:    Medication List     As of 04/28/2012 11:30 AM    TAKE these medications         aspirin EC 81 MG tablet  Take 81 mg by mouth daily.      atorvastatin 20 MG tablet   Commonly known as: LIPITOR   Take 20 mg by mouth daily.      cetirizine 10 MG tablet   Commonly known as: ZYRTEC   Take 10 mg by mouth daily as needed. For allergies      cyclobenzaprine 10 MG tablet    Commonly known as: FLEXERIL   Take 10 mg by mouth 3 (three) times daily as needed. For muscle spasms      EQL COQ10 300 MG Caps   Generic drug: Coenzyme Q10   Take 1 capsule by mouth daily.      hydrochlorothiazide 25 MG tablet   Commonly known as: HYDRODIURIL   Take 25 mg by mouth daily.      losartan 50 MG tablet   Commonly known as: COZAAR   Take 50 mg by mouth daily.      Magnesium 250 MG Tabs   Take 750 mg by mouth daily.      metoprolol succinate 25 MG 24 hr tablet   Commonly known as: TOPROL-XL   Take 1/2 tablet (12.5 mg total) by mouth once daily for 1 week, then stop.      omeprazole 20 MG capsule   Commonly known as: PRILOSEC   Take 20 mg by mouth daily as needed. For acid reflux          Disposition:  Discharge Orders    Future Appointments: Provider: Department: Dept Phone: Center:   06/02/2012 11:30 AM Hillis Range, MD [facility] [phone] [id number]     Future Orders Please Complete By Expires   Diet - low sodium heart healthy      Increase activity slowly      Discharge instructions      Comments:   Please see post ablation discharge instructions.     Follow-up Information    Follow up with Hillis Range, MD. On 06/02/2012. (At 11:30 AM)    Contact information:   [address]  [address] [place] [address] [phone]         Duration of Discharge Encounter: Greater than 30 minutes including physician time.  Signed, Gypsy Balsam, RN, BSN 04/28/2012, 11:30 AM    Hillis Range MD

## 2012-04-28 NOTE — Progress Notes (Signed)
   ELECTROPHYSIOLOGY ROUNDING NOTE    Patient Name: Ricky Montgomery Date of Encounter: 04-28-2012    SUBJECTIVE:Patient feels well.  No chest pain or shortness of breath.  Groin a little tender.  S/p EPS and RFCA 04-27-2012   TELEMETRY: Reviewed telemetry pt in sinus rhythm: Filed Vitals:   04/27/12 1700 04/27/12 2000 04/28/12 0025 04/28/12 0550  BP: 107/80 108/67 116/69 121/72  Pulse: 73 71 67 65  Temp:  97.8 F (36.6 C) 98.1 F (36.7 C) 97.6 F (36.4 C)  TempSrc:  Oral Oral Oral  Resp: 20 19 15 17   Height:  6\' 2"  (1.88 m)    Weight:  241 lb 14.4 oz (109.725 kg) 241 lb 14.4 oz (109.725 kg)   SpO2: 97% 98%  97%    Intake/Output Summary (Last 24 hours) at 04/28/12 4098 Last data filed at 04/28/12 0600  Gross per 24 hour  Intake    240 ml  Output    450 ml  Net   -210 ml   Physical Exam:  GEN- The patient is well appearing, alert and oriented x 3 today.   Head- normocephalic, atraumatic Eyes-  Sclera clear, conjunctiva pink Ears- hearing intact Oropharynx- clear Neck- supple, no JVP Lymph- no cervical lymphadenopathy Lungs- Clear to ausculation bilaterally, normal work of breathing Heart- Regular rate and rhythm, no murmurs, rubs or gallops, PMI not laterally displaced GI- soft, NT, ND, + BS Extremities- no clubbing, cyanosis, or edema MS- no significant deformity or atrophy Skin- no rash or lesion Psych- euthymic mood, full affect Neuro- strength and sensation are intact   1. AVNRT Doing well s/p ablation Decrease metoprolol to 12.5mg  daily x 1 week then stop metoprolol  Wound care, activity restrictions reviewed with patient.   Routine follow up scheduled in 4 weeks.   DC to home  Fayrene Fearing Taje Tondreau,MD

## 2012-04-28 NOTE — Op Note (Signed)
[name], Montgomery NO.:  [id number]  MEDICAL RECORD NO.:  [id number]  LOCATION:  6523                         FACILITY:  [facility]  PHYSICIAN:  Hillis Range, MD       DATE OF BIRTH:  1946/06/14  DATE OF PROCEDURE:  04/27/2012 DATE OF DISCHARGE:                              OPERATIVE REPORT   SURGEON:  Hillis Range, MD  PREPROCEDURE DIAGNOSIS:  Supraventricular tachycardia.  POSTPROCEDURE DIAGNOSIS:  AV nodal reentrant tachycardia.  PROCEDURES: 1. Comprehensive EP study. 2. Coronary sinus pacing and recording. 3. Mapping of supraventricular tachycardia. 4. Ablation of supraventricular tachycardia. 5. Isoproterenol infusion.  INTRODUCTION:  Ricky Montgomery is a pleasant 67 year old gentleman who presents today for EP study and radiofrequency ablation.  He was recently documented to have a short RP narrow complex tachycardia after presenting with abrupt onset of tachy palpitations.  This tachycardia was terminated with vagal maneuvers.  He has been treated with metoprolol without success.  He has chronic rare palpitations, which he attributes to PVCs.  He presents today for EP study and radiofrequency ablation of supraventricular tachycardia.  DESCRIPTION OF PROCEDURE:  Informed written consent was obtained and the patient was brought to the electrophysiology lab in the fasting state. He was adequately sedated with intravenous Versed and fentanyl as outlined in the nursing report.  The patient's right neck and groin were prepped and draped in the usual sterile fashion by the EP lab staff. Using a percutaneous Seldinger technique, one 6-French hemostasis sheath was placed into the right internal jugular vein.  A 6-French curved Tomato catheter was introduced through the right internal jugular vein and advanced into the coronary sinus for recording and pacing from this location.  Two 6-French and one 8-French hemostasis sheaths were placed into the right common  femoral vein.  Two 6-French quadripolar Josephson catheters were introduced through the right common femoral vein and advanced into the His bundle and right ventricular apex positions respectively.  The patient presented to the electrophysiology lab in normal sinus rhythm.  His PR interval measured 195 msec with a QRS duration of 109 msec and a QT interval of 415 msec.  His RR interval measured 799 msec.  His AH interval measured 122 msec with an HV interval of 51 msec.  Ventricular pacing was performed, which revealed midline concentric decremental VA conduction with a VA Wenckebach cycle length of 390 msec.  Ventricular extra stimulus testing was performed, which revealed midline concentric decremental VA conduction with an retrograde AH jump, and double echo beats observed at 500/370 msec.  The retrograde AH jump was consistent and reproducible.  The retrograde AV nodal ERP was 500/280 msec.  Rapid atrial pacing was then performed, which revealed PR greater than RR with no tachycardias induced.  The AV Wenckebach cycle length was 350 msec.  Tachycardia could not be induced. Atrial extra stimulus testing was performed, which revealed decremental AV conduction with no AH jumps, echo beats, or tachycardias observed. The atrial ERP was 450/260 msec.  Atrial extra stimulus testing was again performed with a basic cycle length of 600 msec, which revealed decremental AV conduction with no H. jumps echo beats, or tachycardias observed.  The  atrial ERP was 600/270 msec.  Isoproterenol was then infused at 2 mcg/minute.  Rapid atrial pacing was again performed, which revealed PR greater than RR when pacing at a cycle length of 270 msec with nonsustained AVNRT observed.  The earliest retrograde atrial activation was recorded from the His electrogram with a VA time measuring 30 msec.  This response however could not be reproduced. Atrial extra stimulus testing was performed during  isoproterenol infusion at a basic cycle length of 450 msec, which revealed decremental AV conduction with no AH jumps, echo beats, or tachycardias per the AV nodal ERP.  This maneuver was repeated at a basic cycle length of 500 msec, which revealed decremental AV conduction with no AH jumps, echo beats, or tachycardias.  The AV nodal ERP was 500/220 msec.  Ventricular pacing was performed, which revealed midline concentric decremental VA conduction with a VA Wenckebach cycle length of less than 300 msec. Ventricular extra stimulus testing was performed, which revealed concentric decremental VA conduction with no retrograde jumps, echo beats, or tachycardias observed during isoproterenol infusion. Isoproterenol was therefore discontinued and allowed to wash out. During isoproterenol washout, rapid atrial pacing was again performed, which revealed PR greater than RR with no inducible arrhythmias. Ventricular extra stimulus testing was performed, which revealed a retrograde jump, but no echo beats or tachycardias observed with a basic cycle length of 500 msec.  The ventricular ERP was 500/220.  Given the patient's documented short RP tachycardia with termination during vagal maneuvers and no evidence of accessory pathways today and the presence of dual AV nodal physiology, I elected to perform slow pathway modification today.  The patient did have documented double echo beats today.  A 7-French Biosense Webster 4-mm ablation catheter was introduced through the right common femoral vein and advanced into the right atrium.  Mapping of Koch's triangle was performed, which revealed a rather standard triangle.  A series of 2 radiofrequency applications were delivered with a target temperature of 60 degrees at 50 watts at sites 10 and 9 in Koch's triangle.  During the 2nd radiofrequency application, accelerated junctional rhythm was observed with intact VA conduction.  This lesion was delivered  for 38 seconds at which point the patient had a dropped retrograde atrial beat and RF was discontinued. Following ablation, the AV Wenckebach cycle length was 360 msec with PR equal to, but not greater than RR and no tachycardias observed. Ventricular pacing was performed, which revealed a VA Wenckebach cycle length of 500 msec.  Ventricular extra stimulus testing was performed with a basic cycle length of 550 msec, which revealed no retrograde AH jumps, echo beats, or tachycardias.  The retrograde AV nodal ERP post ablation was 550/440 msec.  Following ablation, the AH interval measured 121 msec with an HV interval of 55 msec.  The procedure was therefore considered completed.  All catheters were removed and sheaths were aspirated and flushed.  The sheaths were removed and hemostasis was assured.  There were no early apparent complications.  CONCLUSIONS: 1. Sinus rhythm upon presentation. 2. The patient had no accessory pathways, however, he did have     documented dual AV nodal physiology today with nonsustained AVNRT     observed. 3. Successful slow pathway modification with no inducible arrhythmias     following ablation. 4. The patient has a history of symptomatic PVCs however during the     procedure today he only had very rare PVCs with isoproterenol     infusion, and no PVCs in the  absence of isoproterenol. 5. No early apparent complications.    Hillis Range, MD    JA/MEDQ  D:  04/27/2012  T:  04/28/2012  Job:  [id number]  cc:   Nanetta Batty, M.D.

## 2012-06-02 ENCOUNTER — Ambulatory Visit (INDEPENDENT_AMBULATORY_CARE_PROVIDER_SITE_OTHER): Payer: Medicare Other | Admitting: Internal Medicine

## 2012-06-02 ENCOUNTER — Encounter: Payer: Self-pay | Admitting: Internal Medicine

## 2012-06-02 VITALS — BP 143/87 | HR 79 | Ht 74.0 in | Wt 245.0 lb

## 2012-06-02 DIAGNOSIS — I498 Other specified cardiac arrhythmias: Secondary | ICD-10-CM

## 2012-06-02 DIAGNOSIS — I471 Supraventricular tachycardia: Secondary | ICD-10-CM

## 2012-06-02 NOTE — Patient Instructions (Signed)
Your physician recommends that you schedule a follow-up appointment as needed  

## 2012-06-04 ENCOUNTER — Encounter: Payer: Self-pay | Admitting: Internal Medicine

## 2012-06-04 NOTE — Progress Notes (Signed)
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  Since his recent ablation for AVNRT, the patient reports doing very well.  He denies any further symptoms of arrhythmia.  Today, he denies symptoms of palpitations, chest pain, shortness of breath,  lower extremity edema, dizziness, presyncope, or syncope.  Erectile dysfunction has improved off of metoprolol.  The patient is otherwise without complaint today.   Past Medical History  Diagnosis Date  . Asthma   . GERD (gastroesophageal reflux disease)   . PVC (premature ventricular contraction)   . Thyroid disease   . Allergy   . Hypertension   . DVT (deep venous thrombosis)     L leg 30 years ago  . TIA (transient ischemic attack) 4 years ago  . SVT (supraventricular tachycardia)   . Refusal of blood transfusions as patient is Jehovah's Witness   . Stroke     hx of TIA  . Seizures     YEARS AGO"   Past Surgical History  Procedure Date  . Thyroidectomy, partial 1946  . Ablation of dysrhythmic focus 04/27/2012    AVNRT ablation  . Shoulder surgery     FRACTURE    Current Outpatient Prescriptions  Medication Sig Dispense Refill  . aspirin EC 81 MG tablet Take 81 mg by mouth daily.      [name] Kitchen atorvastatin (LIPITOR) 20 MG tablet Take 20 mg by mouth daily.      . cetirizine (ZYRTEC) 10 MG tablet Take 10 mg by mouth daily as needed. For allergies      . Coenzyme Q10 (EQL COQ10) 300 MG CAPS Take 1 capsule by mouth daily.      . cyclobenzaprine (FLEXERIL) 10 MG tablet Take 10 mg by mouth 3 (three) times daily as needed. For muscle spasms      . hydrochlorothiazide (HYDRODIURIL) 25 MG tablet Take 25 mg by mouth daily.       [name] Kitchen losartan (COZAAR) 50 MG tablet Take 50 mg by mouth daily.       . Magnesium 250 MG TABS Take 750 mg by mouth daily.      [name] Kitchen omeprazole (PRILOSEC) 20 MG capsule Take 20 mg by mouth daily as needed. For acid reflux      .  penicillin v potassium (VEETID) 500 MG tablet Take 500 mg by mouth 4 (four) times daily.        Physical Exam: Filed Vitals:   06/02/12 1115  BP: 143/87  Pulse: 79  Height: 6\' 2"  (1.88 m)  Weight: 245 lb (111.131 kg)    GEN- The patient is well appearing, alert and oriented x 3 today.   Head- normocephalic, atraumatic Eyes-  Sclera clear, conjunctiva pink Ears- hearing intact Oropharynx- clear Lungs- Clear to ausculation bilaterally, normal work of breathing Heart- Regular rate and rhythm, no murmurs, rubs or gallops, PMI not laterally displaced GI- soft, NT, ND, + BS Extremities- no clubbing, cyanosis, or edema  ekg today reveals sinus rhythm 62 bpm, PR 176, LAHB, otherwise normal ekg  Assessment and Plan:  1. SVT Doing well s/p ablation, without recurrence No changes  Return as needed

## 2012-06-06 ENCOUNTER — Ambulatory Visit (INDEPENDENT_AMBULATORY_CARE_PROVIDER_SITE_OTHER): Payer: Medicare Other | Admitting: Internal Medicine

## 2012-06-06 ENCOUNTER — Encounter: Payer: Self-pay | Admitting: Internal Medicine

## 2012-06-06 VITALS — BP 150/70 | HR 100 | Ht 74.0 in | Wt 248.0 lb

## 2012-06-06 DIAGNOSIS — R599 Enlarged lymph nodes, unspecified: Secondary | ICD-10-CM

## 2012-06-06 NOTE — Progress Notes (Signed)
HPI Ricky Montgomery returns today for an unscheduled visit. He is a 66 year old man who has SVT and underwent catheter ablation by Dr. Johney Frame several weeks ago. At that time, the patient had a catheter placed in the right jugular vein. Approximately 10 days ago, he was diagnosed with strep throat. He was treated with antibiotic therapy. Several days ago, the patient developed a large swollen lymph node near the clavicle on the left. An ultrasound was unrevealing. The patient denies fevers or chills. The swelling in his neck is tender to palpation. There is been no drainage there is no bruising. He denies tachypalpitations.  No Known Allergies   Current Outpatient Prescriptions  Medication Sig Dispense Refill  . aspirin EC 81 MG tablet Take 81 mg by mouth daily.      [name] Kitchen atorvastatin (LIPITOR) 20 MG tablet Take 20 mg by mouth daily.      . cetirizine (ZYRTEC) 10 MG tablet Take 10 mg by mouth daily as needed. For allergies      . Coenzyme Q10 (EQL COQ10) 300 MG CAPS Take 1 capsule by mouth daily.      . cyclobenzaprine (FLEXERIL) 10 MG tablet Take 10 mg by mouth 3 (three) times daily as needed. For muscle spasms      . hydrochlorothiazide (HYDRODIURIL) 25 MG tablet Take 25 mg by mouth daily.       [name] Kitchen losartan (COZAAR) 50 MG tablet Take 50 mg by mouth daily.       . Magnesium 250 MG TABS Take 750 mg by mouth daily.      [name] Kitchen omeprazole (PRILOSEC) 20 MG capsule Take 20 mg by mouth daily as needed. For acid reflux         Past Medical History  Diagnosis Date  . Asthma   . GERD (gastroesophageal reflux disease)   . PVC (premature ventricular contraction)   . Thyroid disease   . Allergy   . Hypertension   . DVT (deep venous thrombosis)     L leg 30 years ago  . TIA (transient ischemic attack) 4 years ago  . SVT (supraventricular tachycardia)   . Refusal of blood transfusions as patient is Jehovah's Witness   . Stroke     hx of TIA  . Seizures     YEARS AGO"    ROS:   All systems reviewed and  negative except as noted in the HPI.   Past Surgical History  Procedure Date  . Thyroidectomy, partial 1946  . Ablation of dysrhythmic focus 04/27/2012    AVNRT ablation  . Shoulder surgery     FRACTURE     Family History  Problem Relation Age of Onset  . Alcohol abuse Other     Grandparent     History   Social History  . Marital Status: Married    Spouse Name: N/A    Number of Children: N/A  . Years of Education: 11   Occupational History  . Retired     Music therapist   Social History Main Topics  . Smoking status: Former Smoker -- 0.5 packs/day for 5 years    Types: Cigarettes    Quit date: 07/12/1964  . Smokeless tobacco: Never Used     Comment: smoked form age [age], none since  . Alcohol Use: No  . Drug Use: No  . Sexually Active: Not on file   Other Topics Concern  . 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     BP 150/70  Pulse 100  Ht 6\' 2"  (1.88 m)  Wt 248 lb (112.492 kg)  BMI 31.84 kg/m2  Physical Exam:  Well appearing NAD HEENT: Unremarkable Neck:  No JVD, no thyromegally, there is a 3.5 x 2 and half centimeter tender non-erythematous area at the medial portion of the clavicle on the left.  Lungs:  Clear with no wheezes, rales, or rhonchi.  HEART:  Regular rate rhythm, no murmurs, no rubs, no clicks Abd:  soft, positive bowel sounds, no organomegally, no rebound, no guarding Ext:  2 plus pulses, no edema, no cyanosis, no clubbing Skin:  No rashes no nodules Neuro:  CN II through XII intact, motor grossly intact  Ultrasound of the neck - no significant thyroid gland adenopathy. Complex (Mass) adjacent to the thyroid gland with no internal blood flow.  Assess/Plan:

## 2012-06-06 NOTE — Assessment & Plan Note (Signed)
This is a new problem. I suspect it is related to the strep throat that he sustained approximately one week ago. I've given the patient very explicit instructions. If the enlarged node does not improve in size in the next 2 weeks, he is instructed to see a surgeon for incision and biopsy. If it gets smaller, as I suspect it will, no further followup will be necessary. For any question, he is instructed to see his primary care physician. This is absolutely not related to his previous catheter ablation of SVT which utilized venous access from the right internal jugular vein, and not the left.

## 2012-06-19 ENCOUNTER — Other Ambulatory Visit (HOSPITAL_COMMUNITY): Payer: Self-pay | Admitting: Nurse Practitioner

## 2012-06-19 DIAGNOSIS — R229 Localized swelling, mass and lump, unspecified: Secondary | ICD-10-CM

## 2012-06-23 ENCOUNTER — Ambulatory Visit (HOSPITAL_COMMUNITY)
Admission: RE | Admit: 2012-06-23 | Discharge: 2012-06-23 | Disposition: A | Discharge status: Home / Self Care | Payer: Medicare Other | Source: Ambulatory Visit | Attending: Nurse Practitioner | Admitting: Nurse Practitioner

## 2012-06-23 ENCOUNTER — Other Ambulatory Visit (HOSPITAL_COMMUNITY): Payer: Self-pay | Admitting: Nurse Practitioner

## 2012-06-23 DIAGNOSIS — R229 Localized swelling, mass and lump, unspecified: Secondary | ICD-10-CM

## 2012-06-23 DIAGNOSIS — R221 Localized swelling, mass and lump, neck: Secondary | ICD-10-CM | POA: Insufficient documentation

## 2012-06-23 DIAGNOSIS — R220 Localized swelling, mass and lump, head: Secondary | ICD-10-CM | POA: Insufficient documentation

## 2012-06-23 MED ORDER — IOHEXOL 300 MG/ML  SOLN
80.0000 mL | Freq: Once | INTRAMUSCULAR | Status: AC | PRN
  Administered 2012-06-23: 80 mL via INTRAVENOUS

## 2013-02-05 ENCOUNTER — Ambulatory Visit: Payer: Medicare Other | Admitting: Cardiology

## 2013-04-14 HISTORY — PX: CARDIAC CATHETERIZATION: SHX172

## 2013-04-20 ENCOUNTER — Encounter: Payer: Self-pay | Admitting: Cardiology

## 2013-04-20 DIAGNOSIS — E785 Hyperlipidemia, unspecified: Secondary | ICD-10-CM

## 2013-04-20 DIAGNOSIS — Z531 Procedure and treatment not carried out because of patient's decision for reasons of belief and group pressure: Secondary | ICD-10-CM | POA: Insufficient documentation

## 2013-04-20 DIAGNOSIS — G459 Transient cerebral ischemic attack, unspecified: Secondary | ICD-10-CM

## 2013-04-20 DIAGNOSIS — IMO0001 Reserved for inherently not codable concepts without codable children: Secondary | ICD-10-CM

## 2013-04-20 DIAGNOSIS — I10 Essential (primary) hypertension: Secondary | ICD-10-CM

## 2013-04-23 ENCOUNTER — Ambulatory Visit (INDEPENDENT_AMBULATORY_CARE_PROVIDER_SITE_OTHER): Payer: Medicare Other | Admitting: Cardiovascular Disease

## 2013-04-23 ENCOUNTER — Encounter: Payer: Self-pay | Admitting: Cardiovascular Disease

## 2013-04-23 VITALS — BP 120/80 | HR 58 | Ht 74.0 in | Wt 222.0 lb

## 2013-04-23 DIAGNOSIS — I10 Essential (primary) hypertension: Secondary | ICD-10-CM

## 2013-04-23 DIAGNOSIS — Z79899 Other long term (current) drug therapy: Secondary | ICD-10-CM

## 2013-04-23 DIAGNOSIS — E782 Mixed hyperlipidemia: Secondary | ICD-10-CM

## 2013-04-23 MED ORDER — AMLODIPINE BESYLATE 5 MG PO TABS: 5.0000 mg | ORAL_TABLET | Freq: Every day | ORAL | Status: DC

## 2013-04-23 NOTE — Assessment & Plan Note (Addendum)
Well-controlled on current medications. The patient wishes to discontinue the losartan and hydrochlorothiazide. I've agreed to do this but will increase his amlodipine 2.5-5 mg a day. He's been instructed to take his blood pressures at home and should be demonstrated increase in blood pressures after his recent medication changes, he will restart his losartan.

## 2013-04-23 NOTE — Patient Instructions (Signed)
  Your physician wants you to follow-up with him in : 1 year with Dr San Morelle will receive a reminder letter in the mail one month in advance. If you don't receive a letter, please call our office to schedule the follow-up appointment.   Your physician recommends that you return for lab work in: 2 months   Your physician has recommended you make the following change in your medication: Stop atorvastatin, stop losartan, stop hydrocholorthiazide, increase amlodipine to 5 mg daily

## 2013-04-23 NOTE — Progress Notes (Signed)
04/23/2013 Ricky Montgomery   [birth year]  [id number]  Primary Physician Marin Comment, FNP Primary Cardiologist: Runell Gess MD Roseanne Reno   HPI:   The patient is a 67 -year-old moderately-overweight married Caucasian male, father of 3 and grandfather of 5 grandchildren, whom I last saw in the office 6 months ago. I catheterized him radially, after a positive MET test showed classic ischemic response, April 14, 2012, revealing normal coronary arteries and normal LV function. 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 He did have presyncope with PSVT and underwent ablation by Dr. Hillis Range without recurrence.   His other problems include hypertension and hyperlipidemia. Since I saw him back in the office 6 months ago he denies chest pain or shortness of breath. He has expressed a desire to simplify his current medical regimen. He suggested that we stop his atorvastatin since he has normal coronary arteries, his losartan and hydrochlorothiazide which I have agreed to.    Current Outpatient Prescriptions  Medication Sig Dispense Refill  . ALPRAZolam (XANAX) 0.5 MG tablet Take 0.5 mg by mouth 2 (two) times daily.       [name] Kitchen amLODipine (NORVASC) 2.5 MG tablet Take 2.5 mg by mouth daily.       [name] Kitchen aspirin EC 81 MG tablet Take 81 mg by mouth daily.      [name] Kitchen b complex vitamins tablet Take 1 tablet by mouth daily.      . cetirizine (ZYRTEC) 10 MG tablet Take 10 mg by mouth daily as needed. For allergies      . HYDROcodone-acetaminophen (NORCO/VICODIN) 5-325 MG per tablet Take 1 tablet by mouth as needed.       [name] Kitchen levothyroxine (SYNTHROID, LEVOTHROID) 25 MCG tablet Take 25 mcg by mouth daily before breakfast.       . Magnesium 250 MG TABS Take 750 mg by mouth daily.      [name] Kitchen omeprazole (PRILOSEC) 20 MG capsule Take 20 mg by mouth daily as needed. For acid reflux      . penicillin v potassium (VEETID) 500 MG tablet Take 500 mg by mouth 3 (three) times daily.       .  promethazine (PHENERGAN) 12.5 MG tablet Take 12.5 mg by mouth every 6 (six) hours as needed for nausea.      . sertraline (ZOLOFT) 50 MG tablet Take 50 mg by mouth daily.       . traZODone (DESYREL) 50 MG tablet Take 50 mg by mouth at bedtime.        No current facility-administered medications for this visit.    No Known Allergies  History   Social History  . Marital Status: Married    Spouse Name: N/A    Number of Children: N/A  . Years of Education: 11   Occupational History  . Retired     Music therapist   Social History Main Topics  . Smoking status: Former Smoker -- 0.50 packs/day for 5 years    Types: Cigarettes    Quit date: 07/12/1964  . Smokeless tobacco: Never Used     Comment: smoked form age [age], none since  . Alcohol Use: No  . Drug Use: No  . Sexual Activity: Not on file   Other Topics Concern  . Not on file   Social History Narrative   Pt lives in [place] with spouse.   Retired Microbiologist business      Pt is a TEFL teacher witness and is clear that he would  decline blood products.     Review of Systems: General: negative for chills, fever, night sweats or weight changes.  Cardiovascular: negative for chest pain, dyspnea on exertion, edema, orthopnea, palpitations, paroxysmal nocturnal dyspnea or shortness of breath Dermatological: negative for rash Respiratory: negative for cough or wheezing Urologic: negative for hematuria Abdominal: negative for nausea, vomiting, diarrhea, bright red blood per rectum, melena, or hematemesis Neurologic: negative for visual changes, syncope, or dizziness All other systems reviewed and are otherwise negative except as noted above.    Blood pressure 120/80, pulse 58, height 6\' 2"  (1.88 m), weight 222 lb (100.699 kg).  General appearance: alert and no distress Neck: no adenopathy, no carotid bruit, no JVD, supple, symmetrical, trachea midline and thyroid not enlarged, symmetric, no tenderness/mass/nodules Lungs:  clear to auscultation bilaterally Heart: regular rate and rhythm, S1, S2 normal, no murmur, click, rub or gallop Extremities: extremities normal, atraumatic, no cyanosis or edema  EKG sinus bradycardia at 58 without ST or T wave changes  ASSESSMENT AND PLAN:   Hypertension Well-controlled on current medications. The patient wishes to discontinue the losartan and hydrochlorothiazide. I've agreed to do this but will increase his amlodipine 2.5-5 mg a day. He's been instructed to take his blood pressures at home and should be demonstrated increase in blood pressures after his recent medication changes, he will restart his losartan.  Dyslipidemia On statin therapy. The patient does have normal coronary arteries but cardiac catheterization performed last year. He wishes to come off his atorvastatin which I have agreed to. We will recheck a lipid liver profile in 2 months      Runell Gess MD [facility], [facility] 04/23/2013 9:40 AM

## 2013-04-23 NOTE — Assessment & Plan Note (Signed)
On statin therapy. The patient does have normal coronary arteries but cardiac catheterization performed last year. He wishes to come off his atorvastatin which I have agreed to. We will recheck a lipid liver profile in 2 months

## 2013-06-28 ENCOUNTER — Telehealth: Payer: Self-pay | Admitting: Cardiovascular Disease

## 2013-06-28 NOTE — Telephone Encounter (Signed)
Returned call and pt verified x 2.  Pt informed message received and RN asked if he still has discharge paper from [facility] in October.  Pt stated he should have them somewhere and was looking for them.  Stated he had forgotten that he was given the lab orders.  Pt unable to locate papers and asked that a copy be mailed to him.  Pt informed RN will mail lab orders.  Pt verbalized understanding and agreed w/ plan.  Pt requested they be mailed to [address], [place], [place] [address].  Mailed.

## 2013-06-28 NOTE — Telephone Encounter (Signed)
Dr Allyson Sabal asked that he have blood work for lipids 2 months from last office visit.  Wants to have labs done at Dr Marin Comment in [facility] number [phone].  Please call him .

## 2013-07-02 ENCOUNTER — Encounter: Payer: Self-pay | Admitting: Cardiovascular Disease

## 2013-07-31 ENCOUNTER — Encounter: Payer: Self-pay | Admitting: Cardiology

## 2013-07-31 ENCOUNTER — Ambulatory Visit (INDEPENDENT_AMBULATORY_CARE_PROVIDER_SITE_OTHER): Payer: Medicare Other | Admitting: Cardiology

## 2013-07-31 VITALS — BP 130/80 | HR 60 | Ht 74.0 in | Wt 214.0 lb

## 2013-07-31 DIAGNOSIS — I10 Essential (primary) hypertension: Secondary | ICD-10-CM

## 2013-07-31 DIAGNOSIS — IMO0001 Reserved for inherently not codable concepts without codable children: Secondary | ICD-10-CM

## 2013-07-31 DIAGNOSIS — Z0389 Encounter for observation for other suspected diseases and conditions ruled out: Secondary | ICD-10-CM

## 2013-07-31 DIAGNOSIS — E785 Hyperlipidemia, unspecified: Secondary | ICD-10-CM

## 2013-07-31 DIAGNOSIS — E079 Disorder of thyroid, unspecified: Secondary | ICD-10-CM

## 2013-07-31 DIAGNOSIS — R634 Abnormal weight loss: Secondary | ICD-10-CM

## 2013-07-31 DIAGNOSIS — I498 Other specified cardiac arrhythmias: Secondary | ICD-10-CM

## 2013-07-31 DIAGNOSIS — I471 Supraventricular tachycardia: Secondary | ICD-10-CM

## 2013-07-31 HISTORY — DX: Abnormal weight loss: R63.4

## 2013-07-31 MED ORDER — AMLODIPINE BESYLATE 10 MG PO TABS: 10.0000 mg | ORAL_TABLET | Freq: Every day | ORAL | Status: DC

## 2013-07-31 NOTE — Progress Notes (Addendum)
07/31/2013   PCP: Suzan Garibaldi, FNP   Chief Complaint  Patient presents with  . Follow-up    elevated bp x 6 months    Primary Cardiologist: Dr. Gwenlyn Found  HPI:  68 -year-old moderately-overweight married Caucasian male, father of 3 and grandfather of 5 grandchildren, who is followed by Dr. Gwenlyn Found was cathed radially, after a positive MET test showed classic ischemic response, April 14, 2012, revealing normal coronary arteries and normal LV function. Dr. 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 He did have presyncope with PSVT and underwent ablation by Dr. Thompson Grayer without recurrence.   His other problems include hypertension and hyperlipidemia.  On his last visit his BP was stable and he wished to back off on meds.  His losartan and HCTZ were stopped.  Since that time he is under significant home stress and he tells me his BP is up and down.  Yesterday with endoscopy his BP was 811 systolic.  Also with recent prednisone for sinusitis he developed elevated BP and awareness of heart beat at 85-95.  He stopped the prednisone.    Recently pt has had 50 pound wt loss without trying.  Also with epigastric pain.  He is followed by GI in [place].  Endo yesterday with infection in abd only, no ulcers.  For colonoscopy this month.  No chest pain, no SOB.   No Known Allergies  Current Outpatient Prescriptions  Medication Sig Dispense Refill  . ALPRAZolam (XANAX) 0.5 MG tablet Take 0.5 mg by mouth 2 (two) times daily.       [name] Kitchen aspirin EC 81 MG tablet Take 81 mg by mouth daily.      . cetirizine (ZYRTEC) 10 MG tablet Take 10 mg by mouth daily as needed. For allergies      . HYDROcodone-acetaminophen (NORCO/VICODIN) 5-325 MG per tablet Take 1 tablet by mouth as needed.       [name] Kitchen levothyroxine (SYNTHROID, LEVOTHROID) 25 MCG tablet Take 25 mcg by mouth daily before breakfast.       . Magnesium 250 MG TABS Take 750 mg by mouth daily.      [name] Kitchen omeprazole (PRILOSEC) 20 MG  capsule Take 20 mg by mouth daily as needed. For acid reflux      . promethazine (PHENERGAN) 12.5 MG tablet Take 12.5 mg by mouth every 6 (six) hours as needed for nausea.      . sertraline (ZOLOFT) 50 MG tablet Take 50 mg by mouth daily.       . traZODone (DESYREL) 50 MG tablet Take 50 mg by mouth at bedtime.       [name] Kitchen amLODipine (NORVASC) 10 MG tablet Take 1 tablet (10 mg total) by mouth daily.  30 tablet  6  . b complex vitamins tablet Take 1 tablet by mouth daily.       No current facility-administered medications for this visit.    Past Medical History  Diagnosis Date  . Asthma   . GERD (gastroesophageal reflux disease)   . PVC (premature ventricular contraction)   . Thyroid disease     hypothyroidism  . Allergy   . Hypertension   . DVT (deep venous thrombosis)     L leg 30 years ago  . TIA (transient ischemic attack) 4 years ago  . SVT (supraventricular tachycardia)     Hx of RFA  . Refusal of blood transfusions as patient is Jehovah's Witness   . Seizures  YEARS AGO"  . Dyslipidemia   . Normal coronary arteries Oct 2013  . Weight loss, non-intentional 07/31/2013    Past Surgical History  Procedure Laterality Date  . Thyroidectomy, partial  1946  . 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 Shoulder surgery      FRACTURE  . Cardiac catheterization  04/14/2013    normal coronary arteries and normal left ventricular function.    SJW:TGRMBOB:OF colds or fevers, + weight changes, with loss Skin:no rashes or ulcers HEENT:no blurred vision, no congestion CV:see HPI PUL:see HPI GI:no diarrhea constipation or melena, no indigestion, + abd tenderness/pain GU:no hematuria, no dysuria MS:no joint pain, no claudication Neuro:no syncope, no lightheadedness Endo:no diabetes, poss thyroid disease followed by PCP  PHYSICAL EXAM BP 130/80  Pulse 60  Ht $R'6\' 2"'uM$  (1.88 m)  Wt 214 lb (97.07 kg)  BMI 27.46 kg/m2 General:Pleasant affect, NAD Skin:Warm and  dry, brisk capillary refill HEENT:normocephalic, sclera clear, mucus membranes moist Neck:supple, no JVD, no bruits  Heart:S1S2 RRR without murmur, gallup, rub or click Lungs:clear without rales, rhonchi, or wheezes PUL:GSPJ, mild epigastric tenderness, + BS, do not palpate liver spleen or masses Ext:no lower ext edema, 2+ pedal pulses, 2+ radial pulses Neuro:alert and oriented, MAE, follows commands, + facial symmetry   ASSESSMENT AND PLAN Hypertension Since stopping his losartan and hctz his BP has been running higher.  He also has had increased stress in his home life.  We discussed options of  Resuming losartan vs increase of norvasc.  He prefers to stick to one med for now.  We discussed side effects of norvasc and he will call for any problems.  He will follow up with Dr. Gwenlyn Found in 6 weeks.  SVT (supraventricular tachycardia)- hx RFA by Dr Rayann Heman Oct 2013 Recently on prednisone for sinus infection, caused elevated BP and awareness of heart beat with rate 85-95.  He has stopped the steroids.  Normal coronary arteries No chest pain.  Weight loss, non-intentional Weight down from 260 to today 214.  Is followed by PCP and GI Dr. Lyndel Safe in [place], just had endoscopy and was diagnosed with infection.  To pick up ABX today.  Plan for colonoscopy within the month.    Unspecified disorder of thyroid Followed by PCP  Dyslipidemia Followed by PCP  Labs 12/14 T chol 200, LDL-C 138 HDL 55 TG 63  LDLp 1396  Lp(a) <15

## 2013-07-31 NOTE — Assessment & Plan Note (Signed)
Recently on prednisone for sinus infection, caused elevated BP and awareness of heart beat with rate 85-95.  He has stopped the steroids.

## 2013-07-31 NOTE — Assessment & Plan Note (Signed)
Weight down from 260 to today 214.  Is followed by PCP and GI Dr. Lyndel Montgomery in [place], just had endoscopy and was diagnosed with infection.  To pick up ABX today.  Plan for colonoscopy within the month.

## 2013-07-31 NOTE — Assessment & Plan Note (Signed)
Followed by PCP

## 2013-07-31 NOTE — Patient Instructions (Signed)
Increase your amlodipine to 10 mg daily, take 2 of your 5 mg tabs until the bottle gone then use 10 mg tabs.  Call if any problems, significant constipation or increase swelling.    Follow up with Dr. Gwenlyn Found in 6 months.

## 2013-07-31 NOTE — Assessment & Plan Note (Signed)
No chest pain

## 2013-07-31 NOTE — Assessment & Plan Note (Signed)
Since stopping his losartan and hctz his BP has been running higher.  He also has had increased stress in his home life.  We discussed options of  Resuming losartan vs increase of norvasc.  He prefers to stick to one med for now.  We discussed side effects of norvasc and he will call for any problems.  He will follow up with Dr. Gwenlyn Found in 6 weeks.

## 2014-03-03 ENCOUNTER — Other Ambulatory Visit: Payer: Self-pay | Admitting: Cardiology

## 2014-03-04 NOTE — Telephone Encounter (Signed)
Rx refill sent to patient pharmacy   

## 2014-04-30 ENCOUNTER — Encounter: Payer: Self-pay | Admitting: Cardiovascular Disease

## 2014-04-30 ENCOUNTER — Ambulatory Visit (INDEPENDENT_AMBULATORY_CARE_PROVIDER_SITE_OTHER): Payer: Medicare Other | Admitting: Cardiovascular Disease

## 2014-04-30 VITALS — BP 142/86 | HR 52 | Ht 74.0 in | Wt 213.0 lb

## 2014-04-30 DIAGNOSIS — I471 Supraventricular tachycardia: Secondary | ICD-10-CM

## 2014-04-30 DIAGNOSIS — I10 Essential (primary) hypertension: Secondary | ICD-10-CM

## 2014-04-30 DIAGNOSIS — E785 Hyperlipidemia, unspecified: Secondary | ICD-10-CM

## 2014-04-30 DIAGNOSIS — Z0389 Encounter for observation for other suspected diseases and conditions ruled out: Secondary | ICD-10-CM

## 2014-04-30 DIAGNOSIS — IMO0001 Reserved for inherently not codable concepts without codable children: Secondary | ICD-10-CM

## 2014-04-30 NOTE — Assessment & Plan Note (Signed)
Status post ablation by Dr. Thompson Grayer October 2013 without recurrence

## 2014-04-30 NOTE — Assessment & Plan Note (Signed)
History of coronary catheterization which I performed radially 04/14/12 after a positive MET test.

## 2014-04-30 NOTE — Assessment & Plan Note (Signed)
Controlled on current medications 

## 2014-04-30 NOTE — Assessment & Plan Note (Signed)
Not on statin therapy, followed by his PCP

## 2014-04-30 NOTE — Patient Instructions (Signed)
Your physician wants you to follow-up in: 1 year with Dr Berry. You will receive a reminder letter in the mail two months in advance. If you don't receive a letter, please call our office to schedule the follow-up appointment.  

## 2014-04-30 NOTE — Progress Notes (Signed)
04/30/2014 Ricky Montgomery   10-22-1945  [id number]  Primary Physician Ricky Garibaldi, FNP Primary Cardiologist: Ricky Harp MD Ricky Montgomery   HPI:  The patient is a 68 year old moderately-overweight married Caucasian male, father of 62 and grandfather of 5 grandchildren, whom I last saw in the office 12 months ago. I catheterized him radially, after a positive MET test showed classic ischemic response, April 14, 2012, revealing normal coronary arteries and normal LV function. 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 He did have presyncope with PSVT and underwent ablation by Dr. Thompson Montgomery without recurrence.   His other problems include hypertension and hyperlipidemia. Since I saw him back in the office 12 months ago he denies chest pain or shortness of breath. We did make some changes to his medicines at his last visit specifically is continuing his statin drugs and she had normal coronary arteries and signifying his antihypertensive medications. He says that he has not had as much energy in a long time and really has a high testosterone level as well.   Current Outpatient Prescriptions  Medication Sig Dispense Refill  . ALPRAZolam (XANAX) 0.5 MG tablet Take 0.5 mg by mouth 2 (two) times daily.       [name] Kitchen amLODipine (NORVASC) 10 MG tablet TAKE 1 TABLET BY MOUTH EVERY DAY  30 tablet  5  . aspirin EC 81 MG tablet Take 81 mg by mouth daily.      [name] Kitchen b complex vitamins tablet Take 1 tablet by mouth daily.      . cetirizine (ZYRTEC) 10 MG tablet Take 10 mg by mouth daily as needed. For allergies      . HYDROcodone-acetaminophen (NORCO/VICODIN) 5-325 MG per tablet Take 1 tablet by mouth as needed.       . Magnesium 250 MG TABS Take 750 mg by mouth daily.      . sertraline (ZOLOFT) 50 MG tablet Take 50 mg by mouth daily.       . traZODone (DESYREL) 50 MG tablet Take 50 mg by mouth at bedtime.        No current facility-administered medications for this visit.     Allergies  Allergen Reactions  . Prednisone Palpitations    History   Social History  . Marital Status: Married    Spouse Name: N/A    Number of Children: N/A  . Years of Education: 11   Occupational History  . Retired     Games developer   Social History Main Topics  . Smoking status: Former Smoker -- 0.50 packs/day for 5 years    Types: Cigarettes    Quit date: 07/12/1964  . Smokeless tobacco: Never Used     Comment: smoked form age [age], none since  . Alcohol Use: No  . Drug Use: No  . Sexual Activity: Not on file   Other Topics Concern  . Not on file   Social History Narrative   Pt lives in [place] with spouse.   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     Review of Systems: General: negative for chills, fever, night sweats or weight changes.  Cardiovascular: negative for chest pain, dyspnea on exertion, edema, orthopnea, palpitations, paroxysmal nocturnal dyspnea or shortness of breath Dermatological: negative for rash Respiratory: negative for cough or wheezing Urologic: negative for hematuria Abdominal: negative for nausea, vomiting, diarrhea, bright red blood per rectum, melena, or  hematemesis Neurologic: negative for visual changes, syncope, or dizziness All other systems reviewed and are otherwise negative except as noted above.    Blood pressure 142/86, pulse 52, height $RemoveBe'6\' 2"'aKeNsuzOW$  (1.88 m), weight 213 lb (96.616 kg).  General appearance: alert and no distress Neck: no adenopathy, no carotid bruit, no JVD, supple, symmetrical, trachea midline and thyroid not enlarged, symmetric, no tenderness/mass/nodules Lungs: clear to auscultation bilaterally Heart: regular rate and rhythm, S1, S2 normal, no murmur, click, rub or gallop Extremities: extremities normal, atraumatic, no cyanosis or edema  EKG sinus bradycardia at 52 with no ST or T wave changes  ASSESSMENT AND PLAN:   HTN  (hypertension) Controlled on current medications  SVT (supraventricular tachycardia)- hx RFA by Dr Ricky Montgomery Oct 2013 Status post ablation by Dr. Thompson Montgomery October 2013 without recurrence  Dyslipidemia Not on statin therapy, followed by his PCP  Normal coronary arteries History of coronary catheterization which I performed radially 04/14/12 after a positive MET test.       Ricky Harp MD [facility], [facility] 04/30/2014 8:38 AM

## 2014-05-13 ENCOUNTER — Other Ambulatory Visit: Payer: Self-pay | Admitting: *Deleted

## 2014-05-13 MED ORDER — AMLODIPINE BESYLATE 10 MG PO TABS: ORAL_TABLET | ORAL | Status: DC

## 2014-05-13 NOTE — Telephone Encounter (Signed)
Current refills adjusted to 90 day supply and redirected to the new pharmacy.

## 2014-06-20 ENCOUNTER — Encounter (HOSPITAL_COMMUNITY): Payer: Self-pay | Admitting: Cardiovascular Disease

## 2014-07-29 DIAGNOSIS — C719 Malignant neoplasm of brain, unspecified: Secondary | ICD-10-CM

## 2014-07-29 HISTORY — DX: Malignant neoplasm of brain, unspecified: C71.9

## 2014-08-28 ENCOUNTER — Encounter: Payer: Self-pay | Admitting: Radiation Oncology

## 2014-09-02 ENCOUNTER — Encounter: Payer: Self-pay | Admitting: Radiation Oncology

## 2014-09-02 ENCOUNTER — Ambulatory Visit
Admission: RE | Admit: 2014-09-02 | Discharge: 2014-09-02 | Disposition: A | Discharge status: Home / Self Care | Payer: Medicare Other | Source: Ambulatory Visit | Attending: Radiation Oncology | Admitting: Radiation Oncology

## 2014-09-02 ENCOUNTER — Other Ambulatory Visit: Payer: Self-pay | Admitting: Cardiovascular Disease

## 2014-09-02 VITALS — BP 140/81 | HR 81 | Temp 98.1°F | Resp 20 | Ht 74.0 in | Wt 209.4 lb

## 2014-09-02 DIAGNOSIS — Z510 Encounter for antineoplastic radiation therapy: Secondary | ICD-10-CM | POA: Diagnosis present

## 2014-09-02 DIAGNOSIS — Z87891 Personal history of nicotine dependence: Secondary | ICD-10-CM | POA: Diagnosis not present

## 2014-09-02 DIAGNOSIS — Z8673 Personal history of transient ischemic attack (TIA), and cerebral infarction without residual deficits: Secondary | ICD-10-CM | POA: Diagnosis not present

## 2014-09-02 DIAGNOSIS — Z7982 Long term (current) use of aspirin: Secondary | ICD-10-CM | POA: Diagnosis not present

## 2014-09-02 DIAGNOSIS — C713 Malignant neoplasm of parietal lobe: Secondary | ICD-10-CM | POA: Diagnosis not present

## 2014-09-02 DIAGNOSIS — F419 Anxiety disorder, unspecified: Secondary | ICD-10-CM | POA: Diagnosis not present

## 2014-09-02 DIAGNOSIS — R569 Unspecified convulsions: Secondary | ICD-10-CM | POA: Insufficient documentation

## 2014-09-02 DIAGNOSIS — Z79899 Other long term (current) drug therapy: Secondary | ICD-10-CM | POA: Insufficient documentation

## 2014-09-02 DIAGNOSIS — L599 Disorder of the skin and subcutaneous tissue related to radiation, unspecified: Secondary | ICD-10-CM | POA: Insufficient documentation

## 2014-09-02 DIAGNOSIS — I10 Essential (primary) hypertension: Secondary | ICD-10-CM | POA: Diagnosis not present

## 2014-09-02 HISTORY — DX: Anxiety disorder, unspecified: F41.9

## 2014-09-02 HISTORY — DX: Malignant neoplasm of brain, unspecified: C71.9

## 2014-09-02 HISTORY — DX: Myoneural disorder, unspecified: G70.9

## 2014-09-02 NOTE — Progress Notes (Signed)
Location/Histology of Brain Tumor: Brain ,Right parietal lesion   Patient presented with symptoms of:  Confusion,and amnesia  Suspicious for seizure as well as visual difficulty    Past or anticipated interventions, if any, per neurosurgery: 07/29/14 Right brain parietal lesion biopsy: Glioblastoma,(WHO grade IV) Excision,  Resident Surgeon: Dr. Arsenio Katz, teaching Surgeon:Dr. 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 No  Pacemaker/ICD? NO  Is the patient on methotrexate? NO  Additional Complaints / other details: Married,  # children, ,Jehova witness, refuses blood transfusions,  Former cigarette smoker, 1.5ppd 3-5 year,  never smokeless tobacco, , no  Alcohol or illicit drug use, ,HX Heart  Ablation 2014, hx remote LLE DVT, HTN,, SVT, , shoulder surgery, mini stroke 2005,Anxxiety,acute encephalopathy, asthma, Allergies: Prednisone,=palpitations, Allergies: Prednisone= palpitations, low

## 2014-09-02 NOTE — Progress Notes (Signed)
Please see the Nurse Progress Note in the MD Initial Consult Encounter for this patient. 

## 2014-09-02 NOTE — Progress Notes (Signed)
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   HISTORY OF PRESENT ILLNESS::Ricky Montgomery is a 69 y.o. male who is seen for an initial consultation visit regarding the patient's diagnosis of glioblastoma multi form status post resection at [facility].  The patient presented with some difficulties including marketed confusion, amnesia, and visual deficits. He also describes some visual hallucinations.  The patient underwent a CT scan which showed some right parietal hypoattenuation. CT scans of the chest abdomen and pelvis did not reveal any sign of malignancy. The patient therefore proceeded to undergo an MRI scan of the brain which revealed a 3.4 cm peripherally enhancing mass within the right parietal- occipital lobe.   The patient receded to undergo a craniotomy for gross total resection of a right-sided parietal/occipital tumor which was completed on 07/29/2014. The patient's pathology returned positive for a glioblastoma multi form. A postoperative brain MRI scan on 07/30/2014 did not show any residual enhancement.  The patient has done well. He has seen surgery postoperatively who felt that he was doing well. His staples have been removed. No further seizures and the patient currently is not on steroids. He was seen by radiation oncology at [facility] who discuss possible postoperative radiation treatment. The patient wished for treatment closer to home and therefore I have been asked to see him today.    PREVIOUS RADIATION THERAPY: No   PAST MEDICAL HISTORY:  has a past medical history of Asthma; GERD (gastroesophageal reflux disease); PVC (premature ventricular contraction); Thyroid disease; Hypertension;  DVT (deep venous thrombosis); TIA (transient ischemic attack) (4 years ago); SVT (supraventricular tachycardia); Refusal of blood transfusions as patient is Jehovah's Witness; Seizures; Dyslipidemia; Normal coronary arteries (Oct 2013); Weight loss, non-intentional (07/31/2013); Allergy; Brain cancer (07/29/14); Anxiety; Neuromuscular disorder; and Stroke (2002).     PAST SURGICAL HISTORY: Past Surgical History  Procedure Laterality Date  . Thyroidectomy, partial  1946  . 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 Shoulder surgery      FRACTURE  . Cardiac catheterization  04/14/2013    normal coronary arteries and normal left ventricular function.  . Left heart catheterization with coronary angiogram N/A 04/14/2012    Procedure: LEFT HEART CATHETERIZATION WITH CORONARY ANGIOGRAM;  Surgeon: Lorretta Harp, MD;  Location: [facility] CATH LAB;  Service: Cardiovascular;  Laterality: N/A;  . Supraventricular tachycardia ablation N/A 04/27/2012    Procedure: SUPRAVENTRICULAR TACHYCARDIA ABLATION;  Surgeon: Thompson Grayer, MD;  Location: [facility] CATH LAB;  Service: Cardiovascular;  Laterality: N/A;     FAMILY HISTORY: family history includes Alcohol abuse in his other.   SOCIAL HISTORY:  reports that he quit smoking about 50 years ago. His smoking use included Cigarettes. He has a 2.5 pack-year smoking history. He has never used smokeless tobacco. He reports that he does not drink alcohol or use illicit drugs.   ALLERGIES: Prednisone   MEDICATIONS:  Current Outpatient Prescriptions  Medication Sig Dispense Refill  . ALPRAZolam (XANAX) 0.5 MG tablet Take 0.5 mg by mouth 2 (two) times daily.     [name] Kitchen amLODipine (NORVASC) 10 MG tablet Take 1 tablet (10 mg total) by mouth daily. 30 tablet 8  . aspirin EC  81 MG tablet Take 81 mg by mouth daily.    [name] Kitchen b complex vitamins tablet Take 1 tablet by mouth daily.    . cetirizine (ZYRTEC) 10 MG tablet Take 10 mg by mouth daily as needed. For allergies     . HYDROcodone-acetaminophen (NORCO/VICODIN) 5-325 MG per tablet Take 1 tablet by mouth as needed.     . Lacosamide 100 MG TABS Take 50 mg by mouth 2 (two) times daily. tapered    . Magnesium 250 MG TABS Take 750 mg by mouth daily.    [name] Kitchen oxyCODONE (OXY IR/ROXICODONE) 5 MG immediate release tablet Take 5 mg by mouth every 4 (four) hours as needed.    . sertraline (ZOLOFT) 50 MG tablet Take 50 mg by mouth daily.     . traZODone (DESYREL) 50 MG tablet Take 50 mg by mouth at bedtime.      No current facility-administered medications for this encounter.     REVIEW OF SYSTEMS:  A 15 point review of systems is documented in the electronic medical record. This was obtained by the nursing staff. However, I reviewed this with the patient to discuss relevant findings and make appropriate changes.  Pertinent items are noted in HPI.    PHYSICAL EXAM:  height is 6\' 2"  (1.88 m) and weight is 209 lb 6.4 oz (94.983 kg). His oral temperature is 98.1 F (36.7 C). His blood pressure is 140/81 and his pulse is 81. His respiration is 20 and oxygen saturation is 99%.   ECOG = 1  0 - Asymptomatic (Fully active, able to carry on all predisease activities without restriction)  1 - Symptomatic but completely ambulatory (Restricted in physically strenuous activity but ambulatory and able to carry out work of a light or sedentary nature. For example, light housework, office work)  2 - Symptomatic, <50% in bed during the day (Ambulatory and capable of all self care but unable to carry out any work activities. Up and about more than 50% of waking hours)  3 - Symptomatic, >50% in bed, but not bedbound (Capable of only limited self-care, confined to bed or chair 50% or more of waking hours)  4 - Bedbound (Completely disabled. Cannot carry on any self-care. Totally confined to bed or chair)  5 - Death   Eustace Pen MM, Creech RH, Tormey DC, et al. [phone]). "Toxicity and response criteria of the [facility]  [facility]". [place] Oncol. 5 (6): 649-55  General: Well-developed, in no acute distress HEENT: Normocephalic, atraumatic; oral cavity clear Neck: Supple without any lymphadenopathy Cardiovascular: Regular rate and rhythm Respiratory: Clear to auscultation bilaterally GI: Soft, nontender, normal bowel sounds Extremities: No edema present Neuro: Pupils are equal and reactive to light. Extraocular movements intact. Left sided homonymous hemianopsia present     LABORATORY DATA:  Lab Results  Component Value Date   WBC 6.8 04/19/2012   HGB 15.5 04/19/2012   HCT 47.3 04/19/2012   MCV 94.6 04/19/2012   PLT 230.0 04/19/2012   Lab Results  Component Value Date   NA 137 04/19/2012   K 4.2 04/19/2012   CL 102 04/19/2012   CO2 28 04/19/2012   No results found for: ALT, AST, GGT, ALKPHOS, BILITOT    RADIOGRAPHY: No results found.     IMPRESSION:  The patient has a diagnosis of glioblastoma multiform status post resection at [facility]. This went well with no residual tumor seen on postoperative imaging. The patient appears to be in the process of recovering  well. Some ongoing difficulties with vision change, decreased memory, and some fatigue.  I discussed with the patient the role of postoperative radiation treatment in this setting. We discussed the potential benefit from such a treatment as well as the possible side effects and risks. We discussed various options including a 6 week course of radiation treatment, a hypo-fractionated course of radiation treatment, and the potential role for systemic treatment both concurrent with radiation and post radiation treatment. We discussed each of these issues in detail. All of his questions were answered.  The patient appears to be recovering well. He indicated today that he wished to proceed with an aggressive course of treatment. He favored 6 weeks of radiation treatment with concurrent chemotherapy. With his continued recovery, I  believe he would tolerate this satisfactorily.    PLAN: The patient will be scheduled for a simulation in the near future such that we can proceed with treatment planning. He will also be scheduled to be seen by medical oncology to further discuss systemic treatment and to coordinate this. Today, the patient indicated a preference for concurrent chemotherapy if medical oncology feels this is suitable. He will proceed with simulation and I therefore anticipate beginning a 6 week course of radiation treatment in the near future.    I spent 60 minutes face to face with the patient and more than 50% of that time was spent in counseling and/or coordination of care.    ________________________________   Jodelle Gross, MD, PhD   **Disclaimer: This note was dictated with voice recognition software. Similar sounding words can inadvertently be transcribed and this note may contain transcription errors which may not have been corrected upon publication of note.**

## 2014-09-02 NOTE — Telephone Encounter (Signed)
Rx(s) sent to pharmacy electronically.  

## 2014-09-11 ENCOUNTER — Ambulatory Visit
Admission: RE | Admit: 2014-09-11 | Discharge: 2014-09-11 | Disposition: A | Discharge status: Home / Self Care | Payer: Medicare Other | Source: Ambulatory Visit | Attending: Radiation Oncology | Admitting: Radiation Oncology

## 2014-09-11 DIAGNOSIS — C713 Malignant neoplasm of parietal lobe: Secondary | ICD-10-CM

## 2014-09-11 DIAGNOSIS — Z510 Encounter for antineoplastic radiation therapy: Secondary | ICD-10-CM | POA: Diagnosis not present

## 2014-09-17 ENCOUNTER — Telehealth: Payer: Self-pay | Admitting: Internal Medicine

## 2014-09-17 NOTE — Telephone Encounter (Signed)
Left Message - regarding nw pt referral appt Dx:  Glioblastoma mulitforme of parietal lobe Referring Dr. Lisbeth Renshaw

## 2014-09-17 NOTE — Telephone Encounter (Signed)
pt confirmed appt for 10/15/14 w/ Julien Nordmann

## 2014-09-18 DIAGNOSIS — Z510 Encounter for antineoplastic radiation therapy: Secondary | ICD-10-CM | POA: Diagnosis not present

## 2014-09-20 ENCOUNTER — Encounter: Payer: Self-pay | Admitting: Hematology and Oncology

## 2014-09-20 ENCOUNTER — Ambulatory Visit (HOSPITAL_BASED_OUTPATIENT_CLINIC_OR_DEPARTMENT_OTHER): Payer: Medicare Other | Admitting: Hematology and Oncology

## 2014-09-20 ENCOUNTER — Telehealth: Payer: Self-pay | Admitting: Hematology and Oncology

## 2014-09-20 ENCOUNTER — Ambulatory Visit: Payer: Medicare Other

## 2014-09-20 DIAGNOSIS — Z86718 Personal history of other venous thrombosis and embolism: Secondary | ICD-10-CM

## 2014-09-20 DIAGNOSIS — C713 Malignant neoplasm of parietal lobe: Secondary | ICD-10-CM

## 2014-09-20 DIAGNOSIS — E039 Hypothyroidism, unspecified: Secondary | ICD-10-CM

## 2014-09-20 DIAGNOSIS — I259 Chronic ischemic heart disease, unspecified: Secondary | ICD-10-CM

## 2014-09-20 DIAGNOSIS — I10 Essential (primary) hypertension: Secondary | ICD-10-CM

## 2014-09-20 MED ORDER — SULFAMETHOXAZOLE-TRIMETHOPRIM 800-160 MG PO TABS
1.0000 | ORAL_TABLET | Freq: Two times a day (BID) | ORAL | Status: DC
Start: 2014-09-20 — End: 2015-01-09

## 2014-09-20 MED ORDER — TEMOZOLOMIDE 140 MG PO CAPS: 75.0000 mg/m2/d | ORAL_CAPSULE | Freq: Every day | ORAL | Status: DC

## 2014-09-20 NOTE — Assessment & Plan Note (Signed)
Right parietal lobe glioblastoma multiforme:  Status post complete resection of a 3.4 cm peripherally enhancing mass within the right parieto-occipital lobe through a craniotomy at [place] on 07/29/2014 with the postoperative brain MRI showing no residual enhancement.  Temozolomide counseling: I discussed the risks and benefits of temozolomide in combination with radiation to decrease the risk of recurrence.  Based on the pivotal trial published in [place] [employer], the median overall survival with radiation with Temodar was 14.6 months versus 12.1 months with radiation alone.  I discussed with him that after concurrent temozolomide radiation , we will evaluate maintenance temozolomide  5 days every  month for 6-12 months.  Temozolomide toxicity discussion: I discussed with the patient that patients can have myelosuppression and cytopenias as well as some opportunistic infection especially with pneumocystis cataract. I recommended that he take PCP prophylaxis. Skin reactions also possible as well as nausea and rashes. Rarely abdominal pain and blurred vision as well as diarrhea have been reported. Majority of patients tolerated temozolomide fairly well.   I would like to get weekly CBC with differential during the concurrent Phase.   We would like to postpone his radiation therapy until temozolomide is available.  Return to clinic in 2 weeks for follow-up

## 2014-09-20 NOTE — Patient Instructions (Signed)
Temozolomide capsules What is this medicine? TEMOZOLOMIDE (te moe ZOE loe mide) is a chemotherapy drug. It is used to treat some kinds of brain cancer. This medicine may be used for other purposes; ask your health care provider or pharmacist if you have questions. COMMON BRAND NAME(S): TEMODAR What should I tell my health care provider before I take this medicine? They need to know if you have any of these conditions: -bleeding problems -infection (especially a virus infection such as chickenpox, cold sores, or herpes) -kidney disease -liver disease -recent or ongoing radiation therapy -an unusual or allergic reaction to temozolomide, dacarbazine (DTIC), other medicines, foods, dyes, or preservatives -pregnant or trying to get pregnant -breast-feeding How should I use this medicine? Take this medicine by mouth with a full glass of water. Do not chew, crush or open the capsules. You can take this medicine with or without food. However, you should always take it the same way each time. However, taking this medicine on an empty stomach may reduce nausea. Taking this medicine at bedtime may also reduce nausea. Follow the directions on the prescription label. Take your medicine at regular intervals. Do not take your medicine more often than directed. Do not stop taking except on your doctor's advice. Talk to your pediatrician regarding the use of this medicine in children. Special care may be needed. Overdosage: If you think you have taken too much of this medicine contact a poison control center or emergency room at once. NOTE: This medicine is only for you. Do not share this medicine with others. What if I miss a dose? If you miss a dose, take it as soon as you can. If it is almost time for your next dose, take only that dose. Do not take double or extra doses. What may interact with this medicine? -vaccines -valproic acid This list may not describe all possible interactions. Give your health  care provider a list of all the medicines, herbs, non-prescription drugs, or dietary supplements you use. Also tell them if you smoke, drink alcohol, or use illegal drugs. Some items may interact with your medicine. What should I watch for while using this medicine? This drug may make you feel generally unwell. This is not uncommon, as chemotherapy can affect healthy cells as well as cancer cells. Report any side effects. Continue your course of treatment even though you feel ill unless your doctor tells you to stop. Call your doctor or health care professional for advice if you get a fever, chills or sore throat, or other symptoms of a cold or flu. Call if you get diarrhea. Do not treat yourself. This drug decreases your body's ability to fight infections. Try to avoid being around people who are sick. This medicine may increase your risk to bruise or bleed. Call your doctor or health care professional if you notice any unusual bleeding. Be careful brushing and flossing your teeth or using a toothpick because you may get an infection or bleed more easily. If you have any dental work done, tell your dentist you are receiving this medicine. Avoid taking products that contain aspirin, acetaminophen, ibuprofen, naproxen, or ketoprofen unless instructed by your doctor. These medicines may hide a fever. Do not open the capsules of this medicine. Breathing in or touching the powder in the capsule may be harmful. If the powder accidentally gets on your skin, wash the area thoroughly. If you have difficulty swallowing, contact your doctor or health care professional. This drug may cause birth defects. Both men  and women who are taking this medicine should use effective birth control. Do not become pregnant while taking this medicine. Women should inform their doctor if they wish to become pregnant or think they might be pregnant. There is a potential for serious side effects to an unborn child. Talk to your health  care professional or pharmacist for more information. Do not breast-feed an infant while taking this medicine. What side effects may I notice from receiving this medicine? Side effects that you should report to your doctor or health care professional as soon as possible: -allergic reactions like skin rash, itching or hives, swelling of the face, lips, or tongue -low blood counts - temozolomide may decrease the number of white blood cells, red blood cells and platelets. You may be at increased risk for infections and bleeding. -signs of infection - fever or chills, cough, sore throat, pain or difficulty passing urine -signs of decreased platelets or bleeding - bruising, pinpoint red spots on the skin, black, tarry stools, blood in the urine -signs of decreased red blood cells - unusually weak or tired, fainting spells, lightheadedness -breathing problems -changes in vision -feeling faint or lightheaded, falls -memory problems -problems with balance, walking -seizures -trouble passing urine or change in the amount of urine -vomiting Side effects that usually do not require medical attention (report to your doctor or health care professional if they continue or are bothersome): -bad taste in the mouth -constipation -difficulty sleeping -dry skin -hair loss -headache -loss of appetite -nausea This list may not describe all possible side effects. Call your doctor for medical advice about side effects. You may report side effects to FDA at 1-800-FDA-1088. Where should I keep my medicine? Keep out of the reach of children. Store at room temperature not above 25 degrees C (77 degrees F). Protect from moisture and heat. Throw away any unused medicine after the expiration date. NOTE: This sheet is a summary. It may not cover all possible information. If you have questions about this medicine, talk to your doctor, pharmacist, or health care provider.  2015, [employer]. (2007-09-13  10:35:49)

## 2014-09-20 NOTE — Telephone Encounter (Signed)
per pof ot sch pt appt-gave pt copy of sch °

## 2014-09-20 NOTE — Progress Notes (Signed)
Checked in new pt with no financial concerns prior to seeing the dr.  Pt has Raquel's card for any billing questions or concerns.  ° °

## 2014-09-20 NOTE — Progress Notes (Signed)
[facility] NOTE  Patient Care Team: Suzan Garibaldi, FNP as PCP - General (Nurse Practitioner)  CHIEF COMPLAINTS/PURPOSE OF CONSULTATION:  Newly diagnosed  Brain tumor  HISTORY OF PRESENTING ILLNESS:  Ricky Montgomery 69 y.o. male is here because of recent diagnosis  Of) occipital glioblastoma multiforme it.  INTERVAL HISTORY: Ricky Montgomery is a  69 year old gentleman with the above-mentioned diagnosis of neoplastic multivitamin. He went to emergency room complaining of visual defects confusion and amnesia and was sent underwent an MRI which revealed a 3.4 send a peripheral enhancing tumor in the right parieto-occipital lobe. He was transferred to [facility] where he underwent craniotomy and resection of this tumor on 07/29/2014. Final pathology came back as glioblastoma deformity. Was discharged 2 days after the surgery. He had a postoperative MRI showing no residual enhancement. He was seen by Dr. Lisbeth Renshaw for radiation oncology to document adjuvant radiation therapy. But he felt that his performance status is improved so much that he can tolerate concurrent Temodar with radiation. We're urgently seeing him to get the Temodar ordered and so that he can get started with radiation.  he complains of pain on the scalp related to recent surgery.  I reviewed her records extensively and collaborated the history with the patient.  MEDICAL HISTORY:  Past Medical History  Diagnosis Date  . Asthma   . GERD (gastroesophageal reflux disease)   . PVC (premature ventricular contraction)   . Thyroid disease     hypothyroidism  . Hypertension   . DVT (deep venous thrombosis)     L leg 30 years ago  . TIA (transient ischemic attack) 4 years ago  . SVT (supraventricular tachycardia)     Hx of RFA  . Refusal of blood transfusions as patient is Jehovah's Witness   . Seizures     YEARS AGO"  . Dyslipidemia   . Normal coronary arteries Oct 2013  . Weight loss, non-intentional  07/31/2013  . Allergy   . 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 Anxiety   . Neuromuscular disorder     left calf, s/p DVT   . 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 Thyroidectomy, partial  1946  . 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 Shoulder surgery      FRACTURE  . Cardiac catheterization  04/14/2013    normal coronary arteries and normal left ventricular function.  . Left heart catheterization with coronary angiogram N/A 04/14/2012    Procedure: LEFT HEART CATHETERIZATION WITH CORONARY ANGIOGRAM;  Surgeon: Lorretta Harp, MD;  Location: [facility] CATH LAB;  Service: Cardiovascular;  Laterality: N/A;  . Supraventricular tachycardia ablation N/A 04/27/2012    Procedure: SUPRAVENTRICULAR TACHYCARDIA ABLATION;  Surgeon: Thompson Grayer, MD;  Location: [facility] CATH LAB;  Service: Cardiovascular;  Laterality: N/A;    SOCIAL HISTORY: History   Social History  . Marital Status: Married    Spouse Name: N/A  . Number of Children: N/A  . Years of Education: 11   Occupational History  . Retired     Games developer   Social History Main Topics  . Smoking status: Former Smoker -- 0.50 packs/day for 5 years    Types: Cigarettes    Quit date: 07/12/1964  . Smokeless tobacco: Never Used     Comment: smoked form age [age], none since  . Alcohol  Use: No  . Drug Use: No  . Sexual Activity: Not Currently   Other Topics Concern  . Not on file   Social History Narrative   Pt lives in [place] with spouse.   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    FAMILY HISTORY: Family History  Problem Relation Age of Onset  . Alcohol abuse Other     Grandparent  . Leukemia Brother 6    ALLERGIES:  is allergic to prednisone.  MEDICATIONS:  Current Outpatient Prescriptions  Medication Sig Dispense  Refill  . ALPRAZolam (XANAX) 0.5 MG tablet Take 0.5 mg by mouth 2 (two) times daily.     [name] Kitchen amLODipine (NORVASC) 10 MG tablet Take 1 tablet (10 mg total) by mouth daily. 30 tablet 8  . aspirin EC 81 MG tablet Take 81 mg by mouth daily.    [name] Kitchen b complex vitamins tablet Take 1 tablet by mouth daily.    . cetirizine (ZYRTEC) 10 MG tablet Take 10 mg by mouth daily as needed. For allergies    . Lacosamide 100 MG TABS Take 50 mg by mouth 2 (two) times daily. tapered    . levETIRAcetam (KEPPRA) 1000 MG tablet Take 1,000 mg by mouth 2 (two) times daily.  3  . Magnesium 250 MG TABS Take 750 mg by mouth daily.    [name] Kitchen oxyCODONE (OXY IR/ROXICODONE) 5 MG immediate release tablet Take 5 mg by mouth every 4 (four) hours as needed.    . sertraline (ZOLOFT) 50 MG tablet Take 50 mg by mouth daily.     . traZODone (DESYREL) 50 MG tablet Take 50 mg by mouth at bedtime.     . sulfamethoxazole-trimethoprim (BACTRIM DS,SEPTRA DS) 800-160 MG per tablet Take 1 tablet by mouth 2 (two) times daily. Mon- Wed-Thu 30 tablet 3  . temozolomide (TEMODAR) 140 MG capsule Take 1 capsule (140 mg total) by mouth daily. May take on an empty stomach or at bedtime to decrease nausea & vomiting. 47 capsule 0   No current facility-administered medications for this visit.    REVIEW OF SYSTEMS:   Constitutional: Denies fevers, chills or abnormal night sweats; pain in scalp. Eyes: Denies blurriness of vision, double vision or watery eyes Ears, nose, mouth, throat, and face: Denies mucositis or sore throat Respiratory: Denies cough, dyspnea or wheezes Cardiovascular: Denies palpitation, chest discomfort or lower extremity swelling Gastrointestinal:  Denies nausea, heartburn or change in bowel habits Skin: Denies abnormal skin rashes Lymphatics: Denies new lymphadenopathy or easy bruising Neurological:Denies numbness, tingling or new weaknesses Behavioral/Psych: Mood is stable, no new changes   All other systems were reviewed with the  patient and are negative.  PHYSICAL EXAMINATION: ECOG PERFORMANCE STATUS: 1 - Symptomatic but completely ambulatory  Filed Vitals:   09/20/14 1519  BP: 131/83  Pulse: 57  Temp: 98 F (36.7 C)  Resp: 18   Filed Weights   09/20/14 1519  Weight: 213 lb 11.2 oz (96.934 kg)    GENERAL:alert, no distress and comfortable SKIN: skin color, texture, turgor are normal, no rashes or significant lesions EYES: normal, conjunctiva are pink and non-injected, sclera clear OROPHARYNX:no exudate, no erythema and lips, buccal mucosa, and tongue normal  NECK: supple, thyroid normal size, non-tender, without nodularity LYMPH:  no palpable lymphadenopathy in the cervical, axillary or inguinal LUNGS: clear to auscultation and percussion with normal breathing effort HEART: regular rate & rhythm and no  murmurs and no lower extremity edema ABDOMEN:abdomen soft, non-tender and normal bowel sounds Musculoskeletal:no cyanosis of digits and no clubbing  PSYCH: alert & oriented x 3 with fluent speech NEURO: no focal motor/sensory deficits  LABORATORY DATA:  I have reviewed the data as listed Lab Results  Component Value Date   WBC 6.8 04/19/2012   HGB 15.5 04/19/2012   HCT 47.3 04/19/2012   MCV 94.6 04/19/2012   PLT 230.0 04/19/2012   Lab Results  Component Value Date   NA 137 04/19/2012   K 4.2 04/19/2012   CL 102 04/19/2012   CO2 28 04/19/2012    ASSESSMENT AND PLAN:  Glioblastoma multiforme of parietal lobe Right parietal lobe glioblastoma multiforme:  Status post complete resection of a 3.4 cm peripherally enhancing mass within the right parieto-occipital lobe through a craniotomy at [place] on 07/29/2014 with the postoperative brain MRI showing no residual enhancement.  Temozolomide counseling: I discussed the risks and benefits of temozolomide in combination with radiation to decrease the risk of recurrence.  Based on the pivotal trial published in [employer] [employer], the median overall survival with radiation with Temodar was 14.6 months versus 12.1 months with radiation alone.  I discussed with him that after concurrent temozolomide radiation , we will evaluate maintenance temozolomide  5 days every  month for 6-12 months.  Temozolomide toxicity discussion: I discussed with the patient that patients can have myelosuppression and cytopenias as well as some opportunistic infection especially with pneumocystis cataract. I recommended that he take PCP prophylaxis. Skin reactions also possible as well as nausea and rashes. Rarely abdominal pain and blurred vision as well as diarrhea have been reported. Majority of patients tolerated temozolomide fairly well.   I would like to get weekly CBC with differential during the concurrent Phase.   We would like to postpone his radiation therapy until temozolomide is available.  Return to clinic in 2 weeks for follow-up   All questions were answered. The patient knows to call the clinic with any problems, questions or concerns.    Rulon Eisenmenger, MD 4:32 PM

## 2014-09-23 ENCOUNTER — Ambulatory Visit: Payer: Medicare Other | Admitting: Radiation Oncology

## 2014-09-23 ENCOUNTER — Ambulatory Visit: Payer: Medicare Other

## 2014-09-23 ENCOUNTER — Telehealth: Payer: Self-pay | Admitting: *Deleted

## 2014-09-23 ENCOUNTER — Telehealth: Payer: Self-pay

## 2014-09-23 ENCOUNTER — Ambulatory Visit: Payer: Medicare Other | Attending: Radiation Oncology

## 2014-09-23 NOTE — Telephone Encounter (Signed)
Forwarded to wrong collarborative nurse.

## 2014-09-23 NOTE — Telephone Encounter (Signed)
[name] called to say that Ricky Montgomery has not arrived so her husband cannot have RT today.  Advised her that a treatment is not scheduled, that this is an Post[facility] appt.  Transferred her to [facility] in [place].

## 2014-09-23 NOTE — Telephone Encounter (Signed)
Received telephone advice record from [facility], sent to scan. Patient's wife stated that prescription had not been called in to pharmacy. Temodar was e scribed and spoke with pharmacy today. Medication will be ready either today or tomorrow. Asked pharmacy to call patient when ready to pick up.

## 2014-09-23 NOTE — Telephone Encounter (Signed)
Fax for clarification of temodar Rx given to Dr Geralyn Flash nurse

## 2014-09-24 ENCOUNTER — Ambulatory Visit: Payer: Medicare Other

## 2014-09-25 ENCOUNTER — Encounter: Payer: Self-pay | Admitting: Hematology and Oncology

## 2014-09-25 ENCOUNTER — Encounter: Payer: Self-pay | Admitting: *Deleted

## 2014-09-25 ENCOUNTER — Telehealth: Payer: Self-pay | Admitting: *Deleted

## 2014-09-25 ENCOUNTER — Ambulatory Visit: Payer: Medicare Other

## 2014-09-25 ENCOUNTER — Ambulatory Visit: Payer: Medicare Other | Admitting: Radiation Oncology

## 2014-09-25 NOTE — Progress Notes (Signed)
Temodar prescription ready at [facility] outpatient pharmacy. Verified with pharmacy that there is no co-pay and patient's wife will pick medicine up tomorrow. Advised patient's wife to take medicine with her when they come for radiation on Monday.  Wife verbalized understanding. Left VMM with Thayer Headings in rad onc that wife will pick up Temodar tomorrow.

## 2014-09-25 NOTE — Telephone Encounter (Signed)
Diane, RN ,Med/Onc called and informed that patint might not be able to start his rdiation treatment on 9/67/89, complications of obtaining Rx Temodar, will keep [place] advised, thanked Diane, informed Dr. Lisbeth Renshaw and [facility] [place] #4 to Therapist hether [facility] 1:22 PM

## 2014-09-25 NOTE — Progress Notes (Signed)
I placed application for possible asst for temodar on the desk of nurse for Dr. Lindi Adie. Per note from the nurse the copay is expensive

## 2014-09-25 NOTE — Progress Notes (Signed)
I called and spoke with Marcie Bal to tell her to make sure the patient sees me on Friday for possible asst for the Temodar. She said Bethena Roys from pharmacy just called about 30 mins ago to advise them they got it worked out for them and not have to pay the large copay.

## 2014-09-26 ENCOUNTER — Ambulatory Visit: Payer: Medicare Other

## 2014-09-27 ENCOUNTER — Other Ambulatory Visit (HOSPITAL_BASED_OUTPATIENT_CLINIC_OR_DEPARTMENT_OTHER): Payer: Medicare Other

## 2014-09-27 ENCOUNTER — Ambulatory Visit: Payer: Medicare Other

## 2014-09-27 DIAGNOSIS — C713 Malignant neoplasm of parietal lobe: Secondary | ICD-10-CM

## 2014-09-27 LAB — CBC WITH DIFFERENTIAL/PLATELET
BASO%: 0.6 % (ref 0.0–2.0)
Basophils Absolute: 0 10*3/uL (ref 0.0–0.1)
EOS%: 2.5 % (ref 0.0–7.0)
Eosinophils Absolute: 0.1 10*3/uL (ref 0.0–0.5)
HCT: 42.5 % (ref 38.4–49.9)
HGB: 14.3 g/dL (ref 13.0–17.1)
LYMPH#: 1.3 10*3/uL (ref 0.9–3.3)
LYMPH%: 25.5 % (ref 14.0–49.0)
MCH: 30.5 pg (ref 27.2–33.4)
MCHC: 33.6 g/dL (ref 32.0–36.0)
MCV: 90.6 fL (ref 79.3–98.0)
MONO#: 0.6 10*3/uL (ref 0.1–0.9)
MONO%: 11.3 % (ref 0.0–14.0)
NEUT#: 3.1 10*3/uL (ref 1.5–6.5)
NEUT%: 60.1 % (ref 39.0–75.0)
Platelets: 174 10*3/uL (ref 140–400)
RBC: 4.69 10*6/uL (ref 4.20–5.82)
RDW: 13.5 % (ref 11.0–14.6)
WBC: 5.1 10*3/uL (ref 4.0–10.3)

## 2014-09-30 ENCOUNTER — Telehealth: Payer: Self-pay

## 2014-09-30 ENCOUNTER — Ambulatory Visit
Admission: RE | Admit: 2014-09-30 | Discharge: 2014-09-30 | Disposition: A | Discharge status: Home / Self Care | Payer: Medicare Other | Source: Ambulatory Visit | Attending: Radiation Oncology | Admitting: Radiation Oncology

## 2014-09-30 DIAGNOSIS — Z510 Encounter for antineoplastic radiation therapy: Secondary | ICD-10-CM | POA: Diagnosis not present

## 2014-09-30 DIAGNOSIS — C713 Malignant neoplasm of parietal lobe: Secondary | ICD-10-CM

## 2014-09-30 MED ORDER — ONDANSETRON HCL 8 MG PO TABS: 8.0000 mg | ORAL_TABLET | Freq: Three times a day (TID) | ORAL | Status: DC | PRN

## 2014-09-30 NOTE — Telephone Encounter (Signed)
Message on desk stating pt is requesting nausea medication. S/w Dr Lindi Adie and e-scribed zofran. lvm for pt.

## 2014-10-01 ENCOUNTER — Ambulatory Visit
Admission: RE | Admit: 2014-10-01 | Discharge: 2014-10-01 | Disposition: A | Discharge status: Home / Self Care | Payer: Medicare Other | Source: Ambulatory Visit | Attending: Radiation Oncology | Admitting: Radiation Oncology

## 2014-10-01 ENCOUNTER — Inpatient Hospital Stay: Admission: RE | Admit: 2014-10-01 | Payer: Self-pay | Source: Ambulatory Visit

## 2014-10-01 DIAGNOSIS — Z510 Encounter for antineoplastic radiation therapy: Secondary | ICD-10-CM | POA: Diagnosis not present

## 2014-10-02 ENCOUNTER — Ambulatory Visit
Admission: RE | Admit: 2014-10-02 | Discharge: 2014-10-02 | Disposition: A | Discharge status: Home / Self Care | Payer: Medicare Other | Source: Ambulatory Visit | Attending: Radiation Oncology | Admitting: Radiation Oncology

## 2014-10-02 ENCOUNTER — Other Ambulatory Visit: Payer: Self-pay

## 2014-10-02 DIAGNOSIS — C713 Malignant neoplasm of parietal lobe: Secondary | ICD-10-CM

## 2014-10-02 DIAGNOSIS — Z510 Encounter for antineoplastic radiation therapy: Secondary | ICD-10-CM | POA: Diagnosis not present

## 2014-10-02 MED ORDER — BIAFINE EX EMUL
Freq: Every day | CUTANEOUS | Status: DC
  Administered 2014-10-02: 09:00:00 via TOPICAL

## 2014-10-02 NOTE — Progress Notes (Signed)
patient education  Brain, ways to manage side effects discussed with patient and wife,. Patient is hoh, gave biafine cream to apply to scalp back of head daily after radiation treatments once skin becomes irritated or itching, and can use prn also,just not  4 hours before rad txs, discussed skin irritation, nausea, vomiting, fatigue, hearing loss's, pain, weight loss, increase protein in diet, stay hydrated,drink plenty fluids, water,gator aid,etc, may need to eat 5-6 smaller meals throughout the day instead of 3 large meals, will see MD weekly after txs and prn, gave radiation  therapy and you book, and my business card, patient stated verbal understanding, wife gave teach back 8:50 AM

## 2014-10-03 ENCOUNTER — Ambulatory Visit
Admission: RE | Admit: 2014-10-03 | Discharge: 2014-10-03 | Disposition: A | Discharge status: Home / Self Care | Payer: Medicare Other | Source: Ambulatory Visit | Attending: Radiation Oncology | Admitting: Radiation Oncology

## 2014-10-03 DIAGNOSIS — Z510 Encounter for antineoplastic radiation therapy: Secondary | ICD-10-CM | POA: Diagnosis not present

## 2014-10-04 ENCOUNTER — Other Ambulatory Visit (HOSPITAL_BASED_OUTPATIENT_CLINIC_OR_DEPARTMENT_OTHER): Payer: Medicare Other

## 2014-10-04 ENCOUNTER — Ambulatory Visit
Admission: RE | Admit: 2014-10-04 | Discharge: 2014-10-04 | Disposition: A | Discharge status: Home / Self Care | Payer: Medicare Other | Source: Ambulatory Visit | Attending: Radiation Oncology | Admitting: Radiation Oncology

## 2014-10-04 ENCOUNTER — Ambulatory Visit (HOSPITAL_BASED_OUTPATIENT_CLINIC_OR_DEPARTMENT_OTHER): Payer: Medicare Other | Admitting: Hematology and Oncology

## 2014-10-04 ENCOUNTER — Telehealth: Payer: Self-pay | Admitting: Hematology and Oncology

## 2014-10-04 VITALS — BP 147/85 | HR 58 | Temp 97.6°F | Resp 18 | Ht 74.0 in | Wt 216.7 lb

## 2014-10-04 DIAGNOSIS — C713 Malignant neoplasm of parietal lobe: Secondary | ICD-10-CM

## 2014-10-04 DIAGNOSIS — Z510 Encounter for antineoplastic radiation therapy: Secondary | ICD-10-CM | POA: Diagnosis not present

## 2014-10-04 LAB — CBC WITH DIFFERENTIAL/PLATELET
BASO%: 1.1 % (ref 0.0–2.0)
Basophils Absolute: 0 10*3/uL (ref 0.0–0.1)
EOS%: 2.5 % (ref 0.0–7.0)
Eosinophils Absolute: 0.1 10*3/uL (ref 0.0–0.5)
HEMATOCRIT: 43.2 % (ref 38.4–49.9)
HGB: 14.3 g/dL (ref 13.0–17.1)
LYMPH%: 27.5 % (ref 14.0–49.0)
MCH: 29.7 pg (ref 27.2–33.4)
MCHC: 33.1 g/dL (ref 32.0–36.0)
MCV: 89.7 fL (ref 79.3–98.0)
MONO#: 0.5 10*3/uL (ref 0.1–0.9)
MONO%: 12.7 % (ref 0.0–14.0)
NEUT#: 2.2 10*3/uL (ref 1.5–6.5)
NEUT%: 56.2 % (ref 39.0–75.0)
Platelets: 194 10*3/uL (ref 140–400)
RBC: 4.81 10*6/uL (ref 4.20–5.82)
RDW: 14.1 % (ref 11.0–14.6)
WBC: 4 10*3/uL (ref 4.0–10.3)
lymph#: 1.1 10*3/uL (ref 0.9–3.3)

## 2014-10-04 NOTE — Progress Notes (Signed)
Patient Care Team: Suzan Garibaldi, FNP as PCP - General (Nurse Practitioner)  DIAGNOSIS: Glioblastoma multiforme resected 07/29/2014  Current treatment: Temodar with radiation started 09/30/2014 CHIEF COMPLIANT: Problems with vision  INTERVAL HISTORY: Ricky Montgomery is a 69 year old with above-mentioned history of glioblastoma multiforme that was resected and is now on adjuvant Temodar with radiation. The treatment started 09/30/2014 and he is tolerating Temodar extremely well. He had nausea on the first day of treatment but had none since then. His problem is his wish and nausea of double vision in his left eye because that he is unable to read too well. He is able to walk around and get exercise.  REVIEW OF SYSTEMS:   Constitutional: Denies fevers, chills or abnormal weight loss Eyes: Denies blurriness of vision Ears, nose, mouth, throat, and face: Denies mucositis or sore throat Respiratory: Denies cough, dyspnea or wheezes Cardiovascular: Denies palpitation, chest discomfort or lower extremity swelling Gastrointestinal:  Denies nausea, heartburn or change in bowel habits Skin: Denies abnormal skin rashes Lymphatics: Denies new lymphadenopathy or easy bruising Neurological: Evidence of prior stroke Behavioral/Psych: Mood is stable, no new changes   All other systems were reviewed with the patient and are negative.  I have reviewed the past medical history, past surgical history, social history and family history with the patient and they are unchanged from previous note.  ALLERGIES:  is allergic to prednisone.  MEDICATIONS:  Current Outpatient Prescriptions  Medication Sig Dispense Refill  . ALPRAZolam (XANAX) 0.5 MG tablet Take 0.5 mg by mouth 2 (two) times daily.     [name] Kitchen amLODipine (NORVASC) 10 MG tablet Take 1 tablet (10 mg total) by mouth daily. 30 tablet 8  . aspirin EC 81 MG tablet Take 81 mg by mouth daily.    [name] Kitchen b complex vitamins tablet Take 1 tablet by mouth daily.    .  cetirizine (ZYRTEC) 10 MG tablet Take 10 mg by mouth daily as needed. For allergies    . emollient (BIAFINE) cream Apply 1 application topically daily. Apply to affected treated area daily starting  after irritation or when itching occurs    . Lacosamide 100 MG TABS Take 50 mg by mouth 2 (two) times daily. tapered    . levETIRAcetam (KEPPRA) 1000 MG tablet Take 1,000 mg by mouth 2 (two) times daily.  3  . Magnesium 250 MG TABS Take 750 mg by mouth daily.    . ondansetron (ZOFRAN) 8 MG tablet Take 1 tablet (8 mg total) by mouth 3 (three) times daily as needed for nausea or vomiting. 30 tablet 3  . oxyCODONE (OXY IR/ROXICODONE) 5 MG immediate release tablet Take 5 mg by mouth every 4 (four) hours as needed.    . sertraline (ZOLOFT) 50 MG tablet Take 50 mg by mouth daily.     [name] Kitchen sulfamethoxazole-trimethoprim (BACTRIM DS,SEPTRA DS) 800-160 MG per tablet Take 1 tablet by mouth 2 (two) times daily. Mon- Wed-Thu 30 tablet 3  . temozolomide (TEMODAR) 140 MG capsule Take 1 capsule (140 mg total) by mouth daily. May take on an empty stomach or at bedtime to decrease nausea & vomiting. 47 capsule 0  . traZODone (DESYREL) 50 MG tablet Take 50 mg by mouth at bedtime.      No current facility-administered medications for this visit.    PHYSICAL EXAMINATION: ECOG PERFORMANCE STATUS: 1 - Symptomatic but completely ambulatory  Filed Vitals:   10/04/14 0855  BP: 147/85  Pulse: 58  Temp: 97.6 F (36.4 C)  Resp: 18  Filed Weights   10/04/14 0855  Weight: 216 lb 11.2 oz (98.294 kg)    GENERAL:alert, no distress and comfortable SKIN: skin color, texture, turgor are normal, no rashes or significant lesions EYES: normal, Conjunctiva are pink and non-injected, sclera clear OROPHARYNX:no exudate, no erythema and lips, buccal mucosa, and tongue normal  NECK: supple, thyroid normal size, non-tender, without nodularity LYMPH:  no palpable lymphadenopathy in the cervical, axillary or inguinal LUNGS: clear to  auscultation and percussion with normal breathing effort HEART: regular rate & rhythm and no murmurs and no lower extremity edema ABDOMEN:abdomen soft, non-tender and normal bowel sounds Musculoskeletal:no cyanosis of digits and no clubbing  NEURO: Slight deviation of angle of mouth and double vision in his left eye   LABORATORY DATA:  I have reviewed the data as listed   Chemistry      Component Value Date/Time   NA 137 04/19/2012 0924   K 4.2 04/19/2012 0924   CL 102 04/19/2012 0924   CO2 28 04/19/2012 0924   BUN 20 04/19/2012 0924   CREATININE 1.1 04/19/2012 0924      Component Value Date/Time   CALCIUM 9.5 04/19/2012 0924       Lab Results  Component Value Date   WBC 4.0 10/04/2014   HGB 14.3 10/04/2014   HCT 43.2 10/04/2014   MCV 89.7 10/04/2014   PLT 194 10/04/2014   NEUTROABS 2.2 10/04/2014    ASSESSMENT & PLAN:  Glioblastoma multiforme of parietal lobe Right parietal lobe glioblastoma multiforme: Status post complete resection of a 3.4 cm peripherally enhancing mass within the right parieto-occipital lobe through a craniotomy at [place] on 07/29/2014 with the postoperative brain MRI showing no residual enhancement.  Current treatment: Temodar with radiation (with prophylactic Bactrim) started 09/30/2014 Temozolomide toxicities: Mild nausea on day 1 but no further problems with nausea his blood counts have been reviewed and they're normal  Monitoring closely for side effects. We will get CBCs in 2 weeks Return to clinic in 2 weeks for follow-up  No orders of the defined types were placed in this encounter.   The patient has a good understanding of the overall plan. he agrees with it. She will call with any problems that may develop before her next visit here.   Rulon Eisenmenger, MD

## 2014-10-04 NOTE — Progress Notes (Signed)
Weekly rad txs brain 5 completed so far, keeps a low grade head ache, mostly incision is, no nausea, no visionj changes, using biafine cream bid ,taking keppra and temodar , walks with a cane, unsteady, appetite good. No pain 9:57 AM

## 2014-10-04 NOTE — Telephone Encounter (Signed)
Gave relative avs and appointments for April. Per relative lab only for next week not needed per VG. Per 3/25 pof lab/fu 2wks.

## 2014-10-04 NOTE — Assessment & Plan Note (Signed)
Right parietal lobe glioblastoma multiforme: Status post complete resection of a 3.4 cm peripherally enhancing mass within the right parieto-occipital lobe through a craniotomy at [place] on 07/29/2014 with the postoperative brain MRI showing no residual enhancement.  Current treatment: Temodar with radiation (with prophylactic Bactrim) Temozolomide toxicities:  Monitoring closely for side effects. We will get CBCs once a week Return to clinic in 2 weeks for follow-up

## 2014-10-04 NOTE — Progress Notes (Signed)
   Department of Radiation Oncology  Phone:  [phone] Fax:        [phone]  Weekly Treatment Note    Name: Ricky Montgomery Date: 10/04/2014 MRN: [id number] DOB: Mar 25, 1946   Current dose: 10 Gy  Current fraction: 5   MEDICATIONS: Current Outpatient Prescriptions  Medication Sig Dispense Refill  . ALPRAZolam (XANAX) 0.5 MG tablet Take 0.5 mg by mouth 2 (two) times daily.     [name] Kitchen amLODipine (NORVASC) 10 MG tablet Take 1 tablet (10 mg total) by mouth daily. 30 tablet 8  . aspirin EC 81 MG tablet Take 81 mg by mouth daily.    [name] Kitchen b complex vitamins tablet Take 1 tablet by mouth daily.    . cetirizine (ZYRTEC) 10 MG tablet Take 10 mg by mouth daily as needed. For allergies    . emollient (BIAFINE) cream Apply 1 application topically daily. Apply to affected treated area daily starting  after irritation or when itching occurs    . Lacosamide 100 MG TABS Take 50 mg by mouth 2 (two) times daily. tapered    . levETIRAcetam (KEPPRA) 1000 MG tablet Take 1,000 mg by mouth 2 (two) times daily.  3  . Magnesium 250 MG TABS Take 750 mg by mouth daily.    . ondansetron (ZOFRAN) 8 MG tablet Take 1 tablet (8 mg total) by mouth 3 (three) times daily as needed for nausea or vomiting. 30 tablet 3  . oxyCODONE (OXY IR/ROXICODONE) 5 MG immediate release tablet Take 5 mg by mouth every 4 (four) hours as needed.    . sertraline (ZOLOFT) 50 MG tablet Take 50 mg by mouth daily.     [name] Kitchen sulfamethoxazole-trimethoprim (BACTRIM DS,SEPTRA DS) 800-160 MG per tablet Take 1 tablet by mouth 2 (two) times daily. Mon- Wed-Thu 30 tablet 3  . temozolomide (TEMODAR) 140 MG capsule Take 1 capsule (140 mg total) by mouth daily. May take on an empty stomach or at bedtime to decrease nausea & vomiting. 47 capsule 0  . traZODone (DESYREL) 50 MG tablet Take 50 mg by mouth at bedtime.      No current facility-administered medications for this encounter.     ALLERGIES: Prednisone   LABORATORY DATA:  Lab Results    Component Value Date   WBC 4.0 10/04/2014   HGB 14.3 10/04/2014   HCT 43.2 10/04/2014   MCV 89.7 10/04/2014   PLT 194 10/04/2014   Lab Results  Component Value Date   NA 137 04/19/2012   K 4.2 04/19/2012   CL 102 04/19/2012   CO2 28 04/19/2012   No results found for: ALT, AST, GGT, ALKPHOS, BILITOT   NARRATIVE: Ricky Montgomery was seen today for weekly treatment management. The chart was checked and the patient's films were reviewed.  Weekly rad txs brain 5 completed so far, keeps a low grade head ache, mostly incision is, no nausea, no visionj changes, using biafine cream bid ,taking keppra and temodar , walks with a cane, unsteady, appetite good. No pain   PHYSICAL EXAMINATION:    patient is doing well. No skin irritation. Weight 217 pounds.  ASSESSMENT: The patient is doing satisfactorily with treatment. He is not currently on steroids.  PLAN: We will continue with the patient's radiation treatment as planned.

## 2014-10-07 ENCOUNTER — Ambulatory Visit: Payer: Medicare Other

## 2014-10-07 ENCOUNTER — Ambulatory Visit
Admission: RE | Admit: 2014-10-07 | Discharge: 2014-10-07 | Disposition: A | Discharge status: Home / Self Care | Payer: Medicare Other | Source: Ambulatory Visit | Attending: Radiation Oncology | Admitting: Radiation Oncology

## 2014-10-07 DIAGNOSIS — Z510 Encounter for antineoplastic radiation therapy: Secondary | ICD-10-CM | POA: Diagnosis not present

## 2014-10-08 ENCOUNTER — Ambulatory Visit
Admission: RE | Admit: 2014-10-08 | Discharge: 2014-10-08 | Disposition: A | Discharge status: Home / Self Care | Payer: Medicare Other | Source: Ambulatory Visit | Attending: Radiation Oncology | Admitting: Radiation Oncology

## 2014-10-08 ENCOUNTER — Ambulatory Visit: Payer: Medicare Other

## 2014-10-08 DIAGNOSIS — Z510 Encounter for antineoplastic radiation therapy: Secondary | ICD-10-CM | POA: Diagnosis not present

## 2014-10-09 ENCOUNTER — Ambulatory Visit
Admission: RE | Admit: 2014-10-09 | Discharge: 2014-10-09 | Disposition: A | Discharge status: Home / Self Care | Payer: Medicare Other | Source: Ambulatory Visit | Attending: Radiation Oncology | Admitting: Radiation Oncology

## 2014-10-09 ENCOUNTER — Encounter: Payer: Self-pay | Admitting: Radiation Oncology

## 2014-10-09 ENCOUNTER — Ambulatory Visit: Payer: Medicare Other

## 2014-10-09 VITALS — BP 139/70 | HR 87 | Temp 97.7°F | Resp 20 | Wt 220.6 lb

## 2014-10-09 DIAGNOSIS — Z510 Encounter for antineoplastic radiation therapy: Secondary | ICD-10-CM | POA: Diagnosis not present

## 2014-10-09 DIAGNOSIS — C713 Malignant neoplasm of parietal lobe: Secondary | ICD-10-CM

## 2014-10-09 NOTE — Progress Notes (Signed)
Weekly rad txs Brain,8/23 completed ,using biafine on back of head daily, mild pink on skin where incision is, c/o head ache there as well 3/4 on 10 scale, not taking anything unles it gets worse stated,no nausea, does get slightly light headed when getting up from linac#4 table after treatment,sits first for a minute, then gets up, uses cane, energy level comes and goes, ate breakfast  No difficulty swallowing 9:18 AM

## 2014-10-09 NOTE — Progress Notes (Signed)
   Department of Radiation Oncology  Phone:  [phone] Fax:        [phone]  Weekly Treatment Note    Name: Ricky Montgomery Date: 10/09/2014 MRN: [id number] DOB: 11-Jun-1946     ICD-9-CM ICD-10-CM   1. 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 ALPRAZolam (XANAX) 0.5 MG tablet Take 0.5 mg by mouth 2 (two) times daily.     [name] Kitchen amLODipine (NORVASC) 10 MG tablet Take 1 tablet (10 mg total) by mouth daily. 30 tablet 8  . aspirin EC 81 MG tablet Take 81 mg by mouth daily.    [name] Kitchen b complex vitamins tablet Take 1 tablet by mouth daily.    . cetirizine (ZYRTEC) 10 MG tablet Take 10 mg by mouth daily as needed. For allergies    . emollient (BIAFINE) cream Apply 1 application topically daily. Apply to affected treated area daily starting  after irritation or when itching occurs    . Lacosamide 100 MG TABS Take 50 mg by mouth 2 (two) times daily. tapered    . levETIRAcetam (KEPPRA) 1000 MG tablet Take 1,000 mg by mouth 2 (two) times daily.  3  . Magnesium 250 MG TABS Take 750 mg by mouth daily.    . ondansetron (ZOFRAN) 8 MG tablet Take 1 tablet (8 mg total) by mouth 3 (three) times daily as needed for nausea or vomiting. 30 tablet 3  . oxyCODONE (OXY IR/ROXICODONE) 5 MG immediate release tablet Take 5 mg by mouth every 4 (four) hours as needed.    . sertraline (ZOLOFT) 50 MG tablet Take 50 mg by mouth daily.     [name] Kitchen sulfamethoxazole-trimethoprim (BACTRIM DS,SEPTRA DS) 800-160 MG per tablet Take 1 tablet by mouth 2 (two) times daily. Mon- Wed-Thu 30 tablet 3  . temozolomide (TEMODAR) 140 MG capsule Take 1 capsule (140 mg total) by mouth daily. May take on an empty stomach or at bedtime to decrease nausea & vomiting. 47 capsule 0  . traZODone (DESYREL) 50 MG tablet Take 50 mg by mouth at bedtime.      No current facility-administered medications for this  encounter.    ALLERGIES: Prednisone   LABORATORY DATA:  Lab Results  Component Value Date   WBC 4.0 10/04/2014   HGB 14.3 10/04/2014   HCT 43.2 10/04/2014   MCV 89.7 10/04/2014   PLT 194 10/04/2014   Lab Results  Component Value Date   NA 137 04/19/2012   K 4.2 04/19/2012   CL 102 04/19/2012   CO2 28 04/19/2012   No results found for: ALT, AST, GGT, ALKPHOS, BILITOT   NARRATIVE: [name] was seen today for weekly treatment management. The chart was checked and the patient's films were reviewed.  HA's are stable. Taking Temodar. Not on a steroid.  No new complaints.   PHYSICAL EXAMINATION:    Filed Vitals:   10/09/14 0914  BP: 139/70  Pulse: 87  Temp: 97.7 F (36.5 C)  Resp: 20  Scalp incision is slightly pink, no drainage. No oral thrush. Walks w/ cane  ASSESSMENT: The patient is doing satisfactorily with treatment. He is not currently on steroids.  PLAN: We will continue with the patient's radiation treatment as planned.   -----------------------------------  Eppie Gibson, MD

## 2014-10-10 ENCOUNTER — Ambulatory Visit: Payer: Medicare Other

## 2014-10-10 ENCOUNTER — Ambulatory Visit
Admission: RE | Admit: 2014-10-10 | Discharge: 2014-10-10 | Disposition: A | Discharge status: Home / Self Care | Payer: Medicare Other | Source: Ambulatory Visit | Attending: Radiation Oncology | Admitting: Radiation Oncology

## 2014-10-10 ENCOUNTER — Ambulatory Visit: Payer: Medicare Other | Admitting: Internal Medicine

## 2014-10-10 DIAGNOSIS — Z510 Encounter for antineoplastic radiation therapy: Secondary | ICD-10-CM | POA: Diagnosis not present

## 2014-10-11 ENCOUNTER — Other Ambulatory Visit: Payer: Medicare Other

## 2014-10-11 ENCOUNTER — Ambulatory Visit: Payer: Medicare Other

## 2014-10-11 ENCOUNTER — Ambulatory Visit
Admission: RE | Admit: 2014-10-11 | Discharge: 2014-10-11 | Disposition: A | Discharge status: Home / Self Care | Payer: Medicare Other | Source: Ambulatory Visit | Attending: Radiation Oncology | Admitting: Radiation Oncology

## 2014-10-11 DIAGNOSIS — Z510 Encounter for antineoplastic radiation therapy: Secondary | ICD-10-CM | POA: Diagnosis not present

## 2014-10-14 ENCOUNTER — Ambulatory Visit: Payer: Medicare Other

## 2014-10-14 ENCOUNTER — Ambulatory Visit: Payer: Medicare Other | Admitting: Internal Medicine

## 2014-10-14 ENCOUNTER — Ambulatory Visit
Admission: RE | Admit: 2014-10-14 | Discharge: 2014-10-14 | Disposition: A | Discharge status: Home / Self Care | Payer: Medicare Other | Source: Ambulatory Visit | Attending: Radiation Oncology | Admitting: Radiation Oncology

## 2014-10-14 DIAGNOSIS — Z510 Encounter for antineoplastic radiation therapy: Secondary | ICD-10-CM | POA: Diagnosis not present

## 2014-10-15 ENCOUNTER — Ambulatory Visit: Payer: Medicare Other

## 2014-10-15 ENCOUNTER — Ambulatory Visit
Admission: RE | Admit: 2014-10-15 | Discharge: 2014-10-15 | Disposition: A | Discharge status: Home / Self Care | Payer: Medicare Other | Source: Ambulatory Visit | Attending: Radiation Oncology | Admitting: Radiation Oncology

## 2014-10-15 DIAGNOSIS — Z510 Encounter for antineoplastic radiation therapy: Secondary | ICD-10-CM | POA: Diagnosis not present

## 2014-10-16 ENCOUNTER — Ambulatory Visit: Payer: Medicare Other

## 2014-10-16 ENCOUNTER — Ambulatory Visit
Admission: RE | Admit: 2014-10-16 | Discharge: 2014-10-16 | Disposition: A | Discharge status: Home / Self Care | Payer: Medicare Other | Source: Ambulatory Visit | Attending: Radiation Oncology | Admitting: Radiation Oncology

## 2014-10-16 ENCOUNTER — Encounter: Payer: Self-pay | Admitting: Radiation Oncology

## 2014-10-16 VITALS — BP 157/84 | HR 58 | Temp 97.6°F | Resp 20 | Wt 218.6 lb

## 2014-10-16 DIAGNOSIS — C713 Malignant neoplasm of parietal lobe: Secondary | ICD-10-CM

## 2014-10-16 DIAGNOSIS — Z510 Encounter for antineoplastic radiation therapy: Secondary | ICD-10-CM | POA: Diagnosis not present

## 2014-10-16 NOTE — Progress Notes (Signed)
Weekly rad txs brain 13/23 completed, mild erythema on back of head where incision is,uses biafine daily and prn, stll has headaches when waking up and  In afternoon and bed times stated, 1-2 now on 10 pain scale, takes tylenol po prn, appetite good, takes nausea med after rad txs, and bedtime prn ,  Walks steady slow with a cnae, energy level  mediocore ,  8:25 AM

## 2014-10-16 NOTE — Progress Notes (Signed)
Department of Radiation Oncology  Phone:  [phone] Fax:        [phone]  Weekly Treatment Note    Name: Ricky Montgomery Date: 10/16/2014 MRN: [id number] DOB: 07/05/1946   Current dose: 26 Gy  Current fraction: 13   MEDICATIONS: Current Outpatient Prescriptions  Medication Sig Dispense Refill  . ALPRAZolam (XANAX) 0.5 MG tablet Take 0.5 mg by mouth 2 (two) times daily.     [name] Kitchen amLODipine (NORVASC) 10 MG tablet Take 1 tablet (10 mg total) by mouth daily. 30 tablet 8  . aspirin EC 81 MG tablet Take 81 mg by mouth daily.    [name] Kitchen b complex vitamins tablet Take 1 tablet by mouth daily.    . cetirizine (ZYRTEC) 10 MG tablet Take 10 mg by mouth daily as needed. For allergies    . emollient (BIAFINE) cream Apply 1 application topically daily. Apply to affected treated area daily starting  after irritation or when itching occurs    . Lacosamide 100 MG TABS Take 50 mg by mouth 2 (two) times daily. tapered    . levETIRAcetam (KEPPRA) 1000 MG tablet Take 1,000 mg by mouth 2 (two) times daily.  3  . Magnesium 250 MG TABS Take 750 mg by mouth daily.    . ondansetron (ZOFRAN) 8 MG tablet Take 1 tablet (8 mg total) by mouth 3 (three) times daily as needed for nausea or vomiting. 30 tablet 3  . oxyCODONE (OXY IR/ROXICODONE) 5 MG immediate release tablet Take 5 mg by mouth every 4 (four) hours as needed.    . sertraline (ZOLOFT) 50 MG tablet Take 50 mg by mouth daily.     [name] Kitchen sulfamethoxazole-trimethoprim (BACTRIM DS,SEPTRA DS) 800-160 MG per tablet Take 1 tablet by mouth 2 (two) times daily. Mon- Wed-Thu 30 tablet 3  . temozolomide (TEMODAR) 140 MG capsule Take 1 capsule (140 mg total) by mouth daily. May take on an empty stomach or at bedtime to decrease nausea & vomiting. 47 capsule 0  . traZODone (DESYREL) 50 MG tablet Take 50 mg by mouth at bedtime.      No current facility-administered medications for this encounter.     ALLERGIES: Prednisone   LABORATORY DATA:  Lab Results    Component Value Date   WBC 4.0 10/04/2014   HGB 14.3 10/04/2014   HCT 43.2 10/04/2014   MCV 89.7 10/04/2014   PLT 194 10/04/2014   Lab Results  Component Value Date   NA 137 04/19/2012   K 4.2 04/19/2012   CL 102 04/19/2012   CO2 28 04/19/2012   No results found for: ALT, AST, GGT, ALKPHOS, BILITOT   NARRATIVE: Ricky Montgomery was seen today for weekly treatment management. The chart was checked and the patient's films were reviewed.  Weekly rad txs brain 13/23 completed, mild erythema on back of head where incision is,uses biafine daily and prn, stll has headaches when waking up and  In afternoon and bed times stated, 1-2 now on 10 pain scale, takes tylenol po prn, appetite good, takes nausea med after rad txs, and bedtime prn ,  Walks steady slow with a cnae, energy level  mediocore ,  12:30 PM   PHYSICAL EXAMINATION: weight is 218 lb 9.6 oz (99.156 kg). His oral temperature is 97.6 F (36.4 C). His blood pressure is 157/84 and his pulse is 58. His respiration is 20 and oxygen saturation is 99%.        ASSESSMENT: The patient is doing satisfactorily with treatment.  PLAN: We will continue with the patient's radiation treatment as planned.

## 2014-10-17 ENCOUNTER — Ambulatory Visit: Payer: Medicare Other

## 2014-10-17 ENCOUNTER — Ambulatory Visit
Admission: RE | Admit: 2014-10-17 | Discharge: 2014-10-17 | Disposition: A | Discharge status: Home / Self Care | Payer: Medicare Other | Source: Ambulatory Visit | Attending: Radiation Oncology | Admitting: Radiation Oncology

## 2014-10-17 DIAGNOSIS — Z510 Encounter for antineoplastic radiation therapy: Secondary | ICD-10-CM | POA: Diagnosis not present

## 2014-10-18 ENCOUNTER — Ambulatory Visit
Admission: RE | Admit: 2014-10-18 | Discharge: 2014-10-18 | Disposition: A | Discharge status: Home / Self Care | Payer: Medicare Other | Source: Ambulatory Visit | Attending: Radiation Oncology | Admitting: Radiation Oncology

## 2014-10-18 ENCOUNTER — Other Ambulatory Visit (HOSPITAL_BASED_OUTPATIENT_CLINIC_OR_DEPARTMENT_OTHER): Payer: Medicare Other

## 2014-10-18 ENCOUNTER — Ambulatory Visit: Payer: Medicare Other

## 2014-10-18 ENCOUNTER — Telehealth: Payer: Self-pay | Admitting: Hematology and Oncology

## 2014-10-18 ENCOUNTER — Ambulatory Visit (HOSPITAL_BASED_OUTPATIENT_CLINIC_OR_DEPARTMENT_OTHER): Payer: Medicare Other | Admitting: Hematology and Oncology

## 2014-10-18 VITALS — BP 132/72 | HR 58 | Temp 97.5°F | Resp 18 | Ht 74.0 in | Wt 220.4 lb

## 2014-10-18 DIAGNOSIS — C713 Malignant neoplasm of parietal lobe: Secondary | ICD-10-CM | POA: Diagnosis not present

## 2014-10-18 DIAGNOSIS — Z510 Encounter for antineoplastic radiation therapy: Secondary | ICD-10-CM | POA: Diagnosis not present

## 2014-10-18 DIAGNOSIS — J069 Acute upper respiratory infection, unspecified: Secondary | ICD-10-CM

## 2014-10-18 LAB — CBC WITH DIFFERENTIAL/PLATELET
BASO%: 1.2 % (ref 0.0–2.0)
Basophils Absolute: 0.1 10*3/uL (ref 0.0–0.1)
EOS ABS: 0.1 10*3/uL (ref 0.0–0.5)
EOS%: 2.6 % (ref 0.0–7.0)
HEMATOCRIT: 43.8 % (ref 38.4–49.9)
HGB: 14.3 g/dL (ref 13.0–17.1)
LYMPH%: 23.1 % (ref 14.0–49.0)
MCH: 29.5 pg (ref 27.2–33.4)
MCHC: 32.6 g/dL (ref 32.0–36.0)
MCV: 90.7 fL (ref 79.3–98.0)
MONO#: 0.6 10*3/uL (ref 0.1–0.9)
MONO%: 12.6 % (ref 0.0–14.0)
NEUT#: 2.7 10*3/uL (ref 1.5–6.5)
NEUT%: 60.5 % (ref 39.0–75.0)
Platelets: 207 10*3/uL (ref 140–400)
RBC: 4.83 10*6/uL (ref 4.20–5.82)
RDW: 13.9 % (ref 11.0–14.6)
WBC: 4.5 10*3/uL (ref 4.0–10.3)
lymph#: 1 10*3/uL (ref 0.9–3.3)

## 2014-10-18 MED ORDER — AMOXICILLIN 500 MG PO TABS: 500.0000 mg | ORAL_TABLET | Freq: Two times a day (BID) | ORAL | Status: DC

## 2014-10-18 NOTE — Telephone Encounter (Signed)
Appointments made and avs printed for patient °

## 2014-10-18 NOTE — Assessment & Plan Note (Signed)
Right parietal lobe glioblastoma multiforme: Status post complete resection of a 3.4 cm peripherally enhancing mass within the right parieto-occipital lobe through a craniotomy at [facility] on 07/29/2014 with the postoperative brain MRI showing no residual enhancement.  Current treatment: Temodar with radiation (with prophylactic Bactrim) started 09/30/2014 Temozolomide toxicities:  1. Mild nausea on day 1 but no further problems with nausea  2. Blood counts have been reviewed and they're normal  Monitoring closely for side effects. We will get CBCs in 2 weeks Return to clinic in 2 weeks for follow-up

## 2014-10-18 NOTE — Progress Notes (Signed)
Patient Care Team: Suzan Garibaldi, FNP as PCP - General (Nurse Practitioner)  DIAGNOSIS: No matching staging information was found for the patient.  SUMMARY OF ONCOLOGIC HISTORY:   Glioblastoma multiforme of parietal lobe   07/29/2014 Initial Diagnosis Glioblastoma multiforme of parietal lobe: Craniotomy and resection of the tumor at [facility] with no residual enhancement on postop MRI   09/30/2014 -  Radiation Therapy Temodar with radiation adjuvant therapy    CHIEF COMPLIANT: Temodar with radiation  INTERVAL HISTORY: Ricky Montgomery is a 69 year old gentleman with glioblastoma multiform status post resection currently on Temodar with radiation. He is tolerating treatment extremely well. He takes a nausea medication which prevents any nausea. He denies any GI symptoms. He is to feel tired but he no longer feels tired. He has full of energy. He does have visual problems but otherwise no new complaints.  REVIEW OF SYSTEMS:   Constitutional: Denies fevers, chills or abnormal weight loss Eyes: Vision loss Ears, nose, mouth, throat, and face: Denies mucositis or sore throat Respiratory: Denies cough, dyspnea or wheezes Cardiovascular: Denies palpitation, chest discomfort or lower extremity swelling Gastrointestinal:  Denies nausea, heartburn or change in bowel habits Skin: Denies abnormal skin rashes Lymphatics: Denies new lymphadenopathy or easy bruising Neurological:Denies numbness, tingling or new weaknesses Behavioral/Psych: Mood is stable, no new changes  All other systems were reviewed with the patient and are negative.  I have reviewed the past medical history, past surgical history, social history and family history with the patient and they are unchanged from previous note.  ALLERGIES:  is allergic to prednisone.  MEDICATIONS:  Current Outpatient Prescriptions  Medication Sig Dispense Refill  . ALPRAZolam (XANAX) 0.5 MG tablet Take 0.5 mg by mouth 2 (two) times daily.     [name] Kitchen  amLODipine (NORVASC) 10 MG tablet Take 1 tablet (10 mg total) by mouth daily. 30 tablet 8  . aspirin EC 81 MG tablet Take 81 mg by mouth daily.    [name] Kitchen b complex vitamins tablet Take 1 tablet by mouth daily.    . cetirizine (ZYRTEC) 10 MG tablet Take 10 mg by mouth daily as needed. For allergies    . emollient (BIAFINE) cream Apply 1 application topically daily. Apply to affected treated area daily starting  after irritation or when itching occurs    . Lacosamide 100 MG TABS Take 50 mg by mouth 2 (two) times daily. tapered    . levETIRAcetam (KEPPRA) 1000 MG tablet Take 1,000 mg by mouth 2 (two) times daily.  3  . Magnesium 250 MG TABS Take 750 mg by mouth daily.    . ondansetron (ZOFRAN) 8 MG tablet Take 1 tablet (8 mg total) by mouth 3 (three) times daily as needed for nausea or vomiting. 30 tablet 3  . oxyCODONE (OXY IR/ROXICODONE) 5 MG immediate release tablet Take 5 mg by mouth every 4 (four) hours as needed.    . sertraline (ZOLOFT) 50 MG tablet Take 50 mg by mouth daily.     [name] Kitchen sulfamethoxazole-trimethoprim (BACTRIM DS,SEPTRA DS) 800-160 MG per tablet Take 1 tablet by mouth 2 (two) times daily. Mon- Wed-Thu 30 tablet 3  . temozolomide (TEMODAR) 140 MG capsule Take 1 capsule (140 mg total) by mouth daily. May take on an empty stomach or at bedtime to decrease nausea & vomiting. 47 capsule 0  . traZODone (DESYREL) 50 MG tablet Take 50 mg by mouth at bedtime.     [name] Kitchen amoxicillin (AMOXIL) 500 MG tablet Take 1 tablet (500 mg total)  by mouth 2 (two) times daily. 14 tablet 0   No current facility-administered medications for this visit.    PHYSICAL EXAMINATION: ECOG PERFORMANCE STATUS: 1 - Symptomatic but completely ambulatory  Filed Vitals:   10/18/14 0912  BP: 132/72  Pulse: 58  Temp: 97.5 F (36.4 C)  Resp: 18   Filed Weights   10/18/14 0912  Weight: 220 lb 6.4 oz (99.973 kg)    GENERAL:alert, no distress and comfortable SKIN: skin color, texture, turgor are normal, no rashes or  significant lesions EYES: normal, Conjunctiva are pink and non-injected, sclera clear OROPHARYNX:no exudate, no erythema and lips, buccal mucosa, and tongue normal  NECK: supple, thyroid normal size, non-tender, without nodularity LYMPH:  no palpable lymphadenopathy in the cervical, axillary or inguinal LUNGS: clear to auscultation and percussion with normal breathing effort HEART: regular rate & rhythm and no murmurs and no lower extremity edema ABDOMEN:abdomen soft, non-tender and normal bowel sounds Musculoskeletal:no cyanosis of digits and no clubbing  NEURO: alert & oriented x 3 with fluent speech   LABORATORY DATA:  I have reviewed the data as listed   Chemistry      Component Value Date/Time   NA 137 04/19/2012 0924   K 4.2 04/19/2012 0924   CL 102 04/19/2012 0924   CO2 28 04/19/2012 0924   BUN 20 04/19/2012 0924   CREATININE 1.1 04/19/2012 0924      Component Value Date/Time   CALCIUM 9.5 04/19/2012 0924       Lab Results  Component Value Date   WBC 4.5 10/18/2014   HGB 14.3 10/18/2014   HCT 43.8 10/18/2014   MCV 90.7 10/18/2014   PLT 207 10/18/2014   NEUTROABS 2.7 10/18/2014   ASSESSMENT & PLAN:  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  Current treatment: Temodar with radiation (with prophylactic Bactrim) started 09/30/2014 Temozolomide toxicities:  1. Mild nausea on day 1 but no further problems with nausea  2. Blood counts have been reviewed and they're normal  Upper respiratory tract infection: I prescribed him amoxicillin 5 twice a day for 1 week  Monitoring closely for side effects. We will get CBCs in 2 weeks Return to clinic in 3 weeks for  follow-up    Orders Placed This Encounter  Procedures  . CBC with Differential    Standing Status: Future     Number of Occurrences:      Standing Expiration Date: 10/18/2015  . Comprehensive metabolic panel (Cmet) - [facility]    Standing Status: Future     Number of Occurrences:      Standing Expiration Date: 10/18/2015   The patient has a good understanding of the overall plan. he agrees with it. he will call with any problems that may develop before her next visit here.   Rulon Eisenmenger, MD

## 2014-10-21 ENCOUNTER — Ambulatory Visit: Payer: Medicare Other

## 2014-10-21 ENCOUNTER — Ambulatory Visit
Admission: RE | Admit: 2014-10-21 | Discharge: 2014-10-21 | Disposition: A | Discharge status: Home / Self Care | Payer: Medicare Other | Source: Ambulatory Visit | Attending: Radiation Oncology | Admitting: Radiation Oncology

## 2014-10-21 DIAGNOSIS — Z510 Encounter for antineoplastic radiation therapy: Secondary | ICD-10-CM | POA: Diagnosis not present

## 2014-10-22 ENCOUNTER — Ambulatory Visit: Payer: Medicare Other

## 2014-10-22 ENCOUNTER — Ambulatory Visit
Admission: RE | Admit: 2014-10-22 | Discharge: 2014-10-22 | Disposition: A | Discharge status: Home / Self Care | Payer: Medicare Other | Source: Ambulatory Visit | Attending: Radiation Oncology | Admitting: Radiation Oncology

## 2014-10-22 DIAGNOSIS — Z510 Encounter for antineoplastic radiation therapy: Secondary | ICD-10-CM | POA: Diagnosis not present

## 2014-10-23 ENCOUNTER — Ambulatory Visit
Admission: RE | Admit: 2014-10-23 | Discharge: 2014-10-23 | Disposition: A | Discharge status: Home / Self Care | Payer: Medicare Other | Source: Ambulatory Visit | Attending: Radiation Oncology | Admitting: Radiation Oncology

## 2014-10-23 ENCOUNTER — Ambulatory Visit: Payer: Medicare Other

## 2014-10-23 DIAGNOSIS — Z510 Encounter for antineoplastic radiation therapy: Secondary | ICD-10-CM | POA: Diagnosis not present

## 2014-10-24 ENCOUNTER — Ambulatory Visit: Payer: Medicare Other

## 2014-10-24 ENCOUNTER — Ambulatory Visit
Admission: RE | Admit: 2014-10-24 | Discharge: 2014-10-24 | Disposition: A | Discharge status: Home / Self Care | Payer: Medicare Other | Source: Ambulatory Visit | Attending: Radiation Oncology | Admitting: Radiation Oncology

## 2014-10-24 DIAGNOSIS — Z510 Encounter for antineoplastic radiation therapy: Secondary | ICD-10-CM | POA: Diagnosis not present

## 2014-10-25 ENCOUNTER — Ambulatory Visit
Admission: RE | Admit: 2014-10-25 | Discharge: 2014-10-25 | Disposition: A | Discharge status: Home / Self Care | Payer: Medicare Other | Source: Ambulatory Visit | Attending: Radiation Oncology | Admitting: Radiation Oncology

## 2014-10-25 ENCOUNTER — Ambulatory Visit: Payer: Medicare Other

## 2014-10-25 ENCOUNTER — Other Ambulatory Visit (HOSPITAL_BASED_OUTPATIENT_CLINIC_OR_DEPARTMENT_OTHER): Payer: Medicare Other

## 2014-10-25 ENCOUNTER — Encounter: Payer: Self-pay | Admitting: Radiation Oncology

## 2014-10-25 VITALS — BP 154/79 | HR 64 | Temp 97.3°F | Resp 16 | Wt 218.7 lb

## 2014-10-25 DIAGNOSIS — C713 Malignant neoplasm of parietal lobe: Secondary | ICD-10-CM

## 2014-10-25 DIAGNOSIS — Z510 Encounter for antineoplastic radiation therapy: Secondary | ICD-10-CM | POA: Diagnosis not present

## 2014-10-25 LAB — COMPREHENSIVE METABOLIC PANEL (CC13)
ALBUMIN: 4.2 g/dL (ref 3.5–5.0)
ALK PHOS: 68 U/L (ref 40–150)
ALT: 26 U/L (ref 0–55)
AST: 27 U/L (ref 5–34)
Anion Gap: 12 mEq/L — ABNORMAL HIGH (ref 3–11)
BUN: 16.9 mg/dL (ref 7.0–26.0)
CALCIUM: 8.2 mg/dL — AB (ref 8.4–10.4)
CHLORIDE: 104 meq/L (ref 98–109)
CO2: 23 mEq/L (ref 22–29)
Creatinine: 1.1 mg/dL (ref 0.7–1.3)
EGFR: 68 mL/min/{1.73_m2} — ABNORMAL LOW (ref >90)
Glucose: 103 mg/dl (ref 70–140)
Potassium: 4.3 mEq/L (ref 3.5–5.1)
SODIUM: 138 meq/L (ref 136–145)
TOTAL PROTEIN: 6.9 g/dL (ref 6.4–8.3)
Total Bilirubin: 0.8 mg/dL (ref 0.20–1.20)

## 2014-10-25 LAB — CBC WITH DIFFERENTIAL/PLATELET
BASO%: 1.1 % (ref 0.0–2.0)
BASOS ABS: 0 10*3/uL (ref 0.0–0.1)
EOS ABS: 0.1 10*3/uL (ref 0.0–0.5)
EOS%: 2.5 % (ref 0.0–7.0)
HEMATOCRIT: 44.8 % (ref 38.4–49.9)
HGB: 14.7 g/dL (ref 13.0–17.1)
LYMPH%: 21.6 % (ref 14.0–49.0)
MCH: 29.7 pg (ref 27.2–33.4)
MCHC: 32.9 g/dL (ref 32.0–36.0)
MCV: 90.4 fL (ref 79.3–98.0)
MONO#: 0.5 10*3/uL (ref 0.1–0.9)
MONO%: 11.6 % (ref 0.0–14.0)
NEUT#: 2.8 10*3/uL (ref 1.5–6.5)
NEUT%: 63.2 % (ref 39.0–75.0)
PLATELETS: 231 10*3/uL (ref 140–400)
RBC: 4.96 10*6/uL (ref 4.20–5.82)
RDW: 14 % (ref 11.0–14.6)
WBC: 4.4 10*3/uL (ref 4.0–10.3)
lymph#: 1 10*3/uL (ref 0.9–3.3)

## 2014-10-25 NOTE — Progress Notes (Signed)
Department of Radiation Oncology  Phone:  [phone] Fax:        [phone]  Weekly Treatment Note    Name: Ricky Montgomery Date: 10/25/2014 MRN: [id number] DOB: [birth year]   Current dose: 40 Gy  Current fraction: 20   MEDICATIONS: Current Outpatient Prescriptions  Medication Sig Dispense Refill  . ALPRAZolam (XANAX) 0.5 MG tablet Take 0.5 mg by mouth 2 (two) times daily.     [name] Kitchen amLODipine (NORVASC) 10 MG tablet Take 1 tablet (10 mg total) by mouth daily. 30 tablet 8  . amoxicillin (AMOXIL) 500 MG tablet Take 1 tablet (500 mg total) by mouth 2 (two) times daily. 14 tablet 0  . aspirin EC 81 MG tablet Take 81 mg by mouth daily.    [name] Kitchen b complex vitamins tablet Take 1 tablet by mouth daily.    . cetirizine (ZYRTEC) 10 MG tablet Take 10 mg by mouth daily as needed. For allergies    . emollient (BIAFINE) cream Apply 1 application topically daily. Apply to affected treated area daily starting  after irritation or when itching occurs    . Lacosamide 100 MG TABS Take 50 mg by mouth 2 (two) times daily. tapered    . levETIRAcetam (KEPPRA) 1000 MG tablet Take 1,000 mg by mouth 2 (two) times daily.  3  . Magnesium 250 MG TABS Take 750 mg by mouth daily.    . ondansetron (ZOFRAN) 8 MG tablet Take 1 tablet (8 mg total) by mouth 3 (three) times daily as needed for nausea or vomiting. 30 tablet 3  . oxyCODONE (OXY IR/ROXICODONE) 5 MG immediate release tablet Take 5 mg by mouth every 4 (four) hours as needed.    . sertraline (ZOLOFT) 50 MG tablet Take 50 mg by mouth daily.     [name] Kitchen sulfamethoxazole-trimethoprim (BACTRIM DS,SEPTRA DS) 800-160 MG per tablet Take 1 tablet by mouth 2 (two) times daily. Mon- Wed-Thu 30 tablet 3  . temozolomide (TEMODAR) 140 MG capsule Take 1 capsule (140 mg total) by mouth daily. May take on an empty stomach or at bedtime to decrease nausea & vomiting. 47 capsule 0  . traZODone (DESYREL) 50 MG tablet Take 50 mg by mouth at bedtime.      No current  facility-administered medications for this encounter.     ALLERGIES: Prednisone   LABORATORY DATA:  Lab Results  Component Value Date   WBC 4.4 10/25/2014   HGB 14.7 10/25/2014   HCT 44.8 10/25/2014   MCV 90.4 10/25/2014   PLT 231 10/25/2014   Lab Results  Component Value Date   NA 137 04/19/2012   K 4.2 04/19/2012   CL 102 04/19/2012   CO2 28 04/19/2012   No results found for: ALT, AST, GGT, ALKPHOS, BILITOT   NARRATIVE: Ricky Montgomery was seen today for weekly treatment management. The chart was checked and the patient's films were reviewed.  Weekly rad txtxs brain 20 completed,  erythma on back of head, uses biafine daily,  slept most of yesterday, completed amoxicillin today, still has vision problems, walks with a cane, appetite good, fatigued at times, today is a good day stated patient 9:47 AM BP 154/79 mmHg  Pulse 64  Temp(Src) 97.3 F (36.3 C) (Oral)  Resp 16  Wt 218 lb 11.2 oz (99.202 kg)  SpO2 100%  Wt Readings from Last 3 Encounters:  10/25/14 218 lb 11.2 oz (99.202 kg)  10/18/14 220 lb 6.4 oz (99.973 kg)  10/16/14 218 lb 9.6 oz (99.156 kg)  PHYSICAL EXAMINATION: weight is 218 lb 11.2 oz (99.202 kg). His oral temperature is 97.3 F (36.3 C). His blood pressure is 154/79 and his pulse is 64. His respiration is 16 and oxygen saturation is 100%.      thrush appears present on the patient's tongue  ASSESSMENT: The patient is doing satisfactorily with treatment.   PLAN: We will continue with the patient's radiation treatment as planned. The lesion has nystatin at home he indicates that he will begin taking this.

## 2014-10-25 NOTE — Progress Notes (Addendum)
Weekly rad txtxs brain 20 completed,  erythma on back of head, uses biafine daily,  slept most of yesterday, completed amoxicillin today, still has vision problems, walks with a cane, appetite good, fatigued at times, today is a good day stated patient 8:49 AM BP 154/79 mmHg  Pulse 64  Temp(Src) 97.3 F (36.3 C) (Oral)  Resp 16  Wt 218 lb 11.2 oz (99.202 kg)  SpO2 100%  Wt Readings from Last 3 Encounters:  10/25/14 218 lb 11.2 oz (99.202 kg)  10/18/14 220 lb 6.4 oz (99.973 kg)  10/16/14 218 lb 9.6 oz (99.156 kg)

## 2014-10-28 ENCOUNTER — Ambulatory Visit: Payer: Medicare Other

## 2014-10-28 ENCOUNTER — Ambulatory Visit
Admission: RE | Admit: 2014-10-28 | Discharge: 2014-10-28 | Disposition: A | Discharge status: Home / Self Care | Payer: Medicare Other | Source: Ambulatory Visit | Attending: Radiation Oncology | Admitting: Radiation Oncology

## 2014-10-28 DIAGNOSIS — Z510 Encounter for antineoplastic radiation therapy: Secondary | ICD-10-CM | POA: Diagnosis not present

## 2014-10-29 ENCOUNTER — Ambulatory Visit
Admission: RE | Admit: 2014-10-29 | Discharge: 2014-10-29 | Disposition: A | Discharge status: Home / Self Care | Payer: Medicare Other | Source: Ambulatory Visit | Attending: Radiation Oncology | Admitting: Radiation Oncology

## 2014-10-29 ENCOUNTER — Ambulatory Visit: Payer: Medicare Other

## 2014-10-29 DIAGNOSIS — Z510 Encounter for antineoplastic radiation therapy: Secondary | ICD-10-CM | POA: Diagnosis not present

## 2014-10-29 NOTE — H&P (Signed)
PATIENT NAME:  LEVANDER, KATZENSTEIN MR#:  [id number] DATE OF BIRTH:  19-May-1946  DATE OF ADMISSION:  02/07/2012  PRIMARY CARE PHYSICIAN: Cyndi Bender, MD  CARDIOLOGIST: Isaias Cowman, MD  CHIEF COMPLAINT: Heart racing since 1830.  HISTORY OF PRESENT ILLNESS: Mr. 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 No seizure since over 10 years. He also has a history of PVCs, for which he sees Dr. Saralyn Pilar. 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    The patient comes today to the ER complaining of heart racing which started around 1830.  The patient has had an episode similar to this with heart rate going in the 140s to 150s two days ago after taking Sudafed for his cold symptoms. The patient was diagnosed with an upper respiratory infection by his primary care physician and was given clarithromycin and Sudafed. After the patient realized that his heart rate was so fast with a couple doses of Sudafed, he decided to stop the medication two days ago.  His heart rate went down to normal after the patient being on rest.  After resolution of that tachycardic event, the patient has not had any problems up until today, at 1830, when he started having racing his heart.  His chest became really tight, difficulty breathing, and his heart rate was in the 180s.  In the ER, the patient was put on the stretcher. He was asked to do Valsalva maneuvers and his heart rate started to come down.  Now he is in normal sinus rhythm and all the symptoms like shortness of breath and chest tightness went away.  He denies anything like this happening before. In the past he came to the ER about two years ago with chest tightness which was due to severe elevation of his blood pressure, but since then he has seen his cardiologist and he had a treadmill  stress test done four months ago and he passed it with flying colors.    PAST MEDICAL HISTORY:  1. History of seizures, but he has been seizure-free for over 10 years. He is no longer taking medications. 2. Deep venous thrombosis history, about 20 years ago. The patient was a truck driver.  3. History of premature ventricular complexes.  DRUG ALLERGIES: No known drug allergies.   MEDICATIONS:  1. Clarithromycin 500 mg every 12 hours, due to finish today. 2. Hydrochlorothiazide 25 mg once daily. 3. Losartan 50 mg once daily. 4. Metoprolol ER 25 mg once daily.  PAST SURGICAL HISTORY:  1. Rotator cuff repair, left side. 2. Goiter surgery when he was a child.   FAMILY HISTORY: His mother is 22 years old and still alive. His father died of unknown causes. He denies any coronary artery disease, diabetes, or other problems in his family.  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 He lives with his wife. He smoked for three months during college and then quit and has not smoked since then. He used to drink moderately, but has not had a drink since 50 years ago.   REVIEW OF SYSTEMS: CONSTITUTIONAL: Denies any fever, fatigue, weakness, weight loss or weight gain. ENT: Denies any eye problems, inflammation, changes in vision. Denies any tinnitus, hearing loss, or earaches. He does have recent diagnosis of upper  respiratory infection with congestion and cough, but it has resolved. Denies any cough or shortness of breath at the moment, but during the episode of SVT the patient had significant chest tightness and shortness of breath that has resolved. CARDIOVASCULAR: No significant history of chest pain other than the one two years ago whenever he was in hypertensive urgency. He has followup with a cardiologist. He has PVCs. Positive for SVT today. GASTROINTESTINAL: Denies any abdominal pain, constipation, diarrhea, or significant gastroesophageal reflux disease. GENITOURINARY:  Denies any dysuria, hematuria, kidney stones, or changes in frequency. Negative for external genital lesions. ENDOCRINE: Denies any polyuria, polydipsia, or polyphagia. His blood sugar today is 168, although the patient denies having diabetes or any of those symptoms. Denies any thyroid disease. He had a goiter when he was a child. 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 HEMATOLOGIC/LYMPHATIC: Negative for bleeding, easy bruising, or anemia.  MUSCULOSKELETAL: The patient has no significant osteoarthritis. He had rotator cuff repair surgery. Since then his shoulder has done very good. NEUROLOGIC: Denies any numbness, weakness, history of CVA or TIA.  Positive history of seizures that have resolved. No seizure over 10 years. PSYCHIATRIC: Negative for depression and anxiety.   PHYSICAL EXAMINATION:  VITALS:  Blood pressure 127/76, pulse at the moment is 57 but it went up to 179 during the SVT, temperature 98.2, and pulse oximetry 98% on room air.  GENERAL:  The patient is alert and oriented x3, in no acute distress. No respiratory distress. He is very pleasant, cooperative. Looks well hydrated.   HEENT: Pupils are equal and reactive. Extraocular movements are intact. Mucosa is moist. Head is normocephalic, atraumatic. Anicteric sclerae. Pink conjunctivae.  No oropharyngeal exudate. No nasal discharge.   NECK: Supple. No JVD. No thyromegaly. No adenopathy. There are some eschars at the level of his left neck after removal of goiter. Trachea is central.  HEART: Regular rate and rhythm. No murmurs, rubs or gallops are appreciated at this moment.   LUNGS: Clear without any wheezing or crepitus. There is good bilateral air entrance.  ABDOMEN: Soft, nontender, nondistended. No hepatosplenomegaly. No masses. Bowel sounds are positive.   GENITOURINARY: Examination is deferred.  EXTREMITIES:  No edema, no cyanosis, no clubbing.   NEURO: Cranial  nerves II through XII intact. Strength is 5 out of 5 in all four extremities. No gross neurologic abnormalities.   SKIN: No significant petechiae or rashes.   PSYCH: Negative for agitation. The patient is actually very pleasant and he is alert and oriented x3.   MUSCULOSKELETAL: No joint deformity or joint effusion.   LABORATORY, DIAGNOSTIC AND RADIOLOGIC DATA: Blood sugar 168, BUN 40, creatinine 1.27, sodium 134. Cardiac enzymes on admission: Troponin 0.03 and then repeat test showed a troponin of 0.07. TSH 195. White blood cells 14.1, hemoglobin 16.  EKG: Mostly supraventricular tachycardia on admission.  After resolved sinus rhythm with occasional PVCs, but no ST depression, elevation or T wave changes.   Chest x-ray: Normal overall. No signs of effusion or consolidation.  ASSESSMENT AND PLAN:  1. Supraventricular tachycardia. The patient actually had a similar event a couple of nights ago after taking Sudafed, several doses, for upper respiratory tract infection. The problem resolved after the patient laid down and rested. This time his heart rate was faster and he was more symptomatic. He had significant shortness of breath and tightness of his chest. His heart rate was up to 180. The problem resolved  after Valsalva maneuvers. He does have a cardiologist, Dr. Saralyn Pilar, who I am going to consultation to see if he wants to see the patient in the morning. We are going to order an echocardiogram just to evaluate heart structure. He had in the past a stress test which reportedly was normal. I am going to continue his home medications including his beta blocker, metoprolol. If there is continuation of SVT, we can try first Valsalva maneuvers since they worked this time or the use of adenosine p.r.n.  2. Hypertension. The patient has well controlled high blood pressure.  3. History of PVCs. The is currently on a beta blocker.  4. Hyperglycemia. The patient does not have history of diabetes. We are  going to get fasting glucose in the morning. If elevated recommend to order a Hemoglobin A1c.  5. The patient has received some IV fluids. I am not sure if he did receive D5.  6. Deep venous thrombosis prophylaxis. At this moment we are going to use sequential compression devices. 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 There is no high suspicion for PE since the patient has no significant shortness of breath, no significant          changes in oxygen saturation, and Homans are negative. If there is any desaturation of oxygen, we are going to go ahead and order a CT or lower extremity Dopplers as well.   TIME SPENT: About 35 minutes.  ____________________________ [place] Sink, MD rsg:slb D: 02/07/2012 03:55:48 ET T: 02/07/2012 09:51:24 ET JOB#: [id number]  cc: [place] Sink, MD, <Dictator> Cyndi Bender, MD Cristi Loron MD ELECTRONICALLY SIGNED 02/26/2012 13:05

## 2014-10-29 NOTE — Consult Note (Signed)
General Aspect patient is a 69 year old male with history of hypertension, mildly reduced left ventricular function, PVCs and sinus bradycardia who was admitted after a second episode of SVT.  Patient states that he had an upper respiratory congestion and was placed on Sudafed by his primary care provider.  He took the medication on Tuesday of the past week.  On Tuesday evening, he noted an episode of rapid heart rate.  This was broken with deep breathing.  He discontinued this Sudafed and had a recurrent episode rapid heart rate last PM prompting him to present to the emergency room where he noted a narrow complex tachycardia.  He converted to sinus rhythm and currently remains in sinus rhythm.  His electrolytes were without significant abnormality.  He had a diagnosis of hypothyroidism but laboratories during this admission revealed a normal thyroid level.  He had no significant serum troponin elevation.  He had a recent exercise stress echo done in the office on Dec 03, 2011 where he exercised 7 minutes and 33 seconds with no evidence of ischemia or chest pain.  He is currently hemodynamically stable.  He has had no other episodes or rapid or irregular heartbeat.   Physical Exam:   GEN well developed, well nourished, no acute distress    HEENT PERRL, hearing intact to voice    NECK supple  No masses  thyroid not tender    RESP normal resp effort  clear BS    CARD Regular rate and rhythm  No murmur    ABD denies tenderness  normal BS    LYMPH negative neck, negative axillae    EXTR negative cyanosis/clubbing, negative edema    SKIN normal to palpation    NEURO cranial nerves intact, motor/sensory function intact    PSYCH A+O to time, place, person   Review of Systems:   Subjective/Chief Complaint rapid heart rate    General: No Complaints    Skin: No Complaints    ENT: No Complaints    Eyes: No Complaints    Neck: No Complaints    Respiratory: No Complaints     Cardiovascular: Palpitations    Gastrointestinal: No Complaints    Genitourinary: No Complaints    Vascular: No Complaints    Musculoskeletal: No Complaints    Neurologic: No Complaints    Hematologic: No Complaints    Endocrine: No Complaints    Psychiatric: No Complaints    Review of Systems: All other systems were reviewed and found to be negative    Medications/Allergies Reviewed Medications/Allergies reviewed     DVT - Deep Vein Thrombosis:    Seizures:        Admit Diagnosis:   SVT: 07-Feb-2012, Active, SVT      Admit Reason:   SVT (supraventricular tachycardia): (427.89) 07-Feb-2012, Active, ICD9, Other specified cardiac dysrhythmias  EKG:   EKG NSR    Interpretation supraventricular tachycardia converted to sinus rhythm.    No Known Allergies:     Impression 69 year old male with history of PVCs, hypertension and mildly reduced left ventricular function by history who was admitted after presenting to the emergency room with rapid heart rate.  Was noted to be in a narrow complex tachycardia.  Converted back to sinus rhythm.  He is ruled out for myocardial infarction.  He has had 2 episodes of this rhythm in the last week.  The first episode appears to have been exacerbated by Sudafed ingestion.  The episode that prompted presentation was not associated to any  stimulant intake per the patient's report.  He is ruled out for myocardial infarction.  Echocardiogram is pending but the patient had an exercise stress echo 2 months ago which revealed no significant ischemia.  I discussed with the patient and his wife extensively regarding avoidance of stimulants including Sudafed or other decongestants containing Sudafed breath.  He also was informed of reducing or eliminating caffeine intake.  Would recommend consideration for increasing metoprolol succinate to 50 mg daily following resting heart rate.  Echocardiogram data is pending and will guide further therapy.     Plan 1.  Await echocardiogram 2.  Avoid stimulants such as Sudafed, caffeine or nicotine 3.  Increase metoprolol succinate 50 mg daily following resting heart rate.  May need to reduce or eliminate losartan and were hydrochlorothiazide should his blood pressure level be to low causing symptoms 4.  Consideration for 48-hour Holter monitor at time of discharge to guide further therapy as outpatient 5.  Should the patient's heart rate remained stable, echo be unremarkable, would recommend discharge later today with a 48-hour Holter monitor and followup with Dr. Saralyn Pilar   Electronic Signatures: Teodoro Spray (MD)  (Signed 29-Jul-13 10:05)  Authored: General Aspect/Present Illness, History and Physical Exam, Review of System, Past Medical History, Health Issues, EKG , Allergies, Impression/Plan   Last Updated: 29-Jul-13 10:05 by Teodoro Spray (MD)

## 2014-10-29 NOTE — Discharge Summary (Signed)
PATIENT NAME:  Ricky Montgomery, Ricky Montgomery MR#:  [id number] DATE OF BIRTH:  [birth year]  DATE OF ADMISSION:  02/07/2012 DATE OF DISCHARGE:  02/07/2012  ADMITTING DIAGNOSIS: Supraventricular tachycardia.   DISCHARGE DIAGNOSES: 1. Supraventricular tachycardia. 2. Chest pain with supraventricular tachycardia, resolved. Minimally elevated troponin, now acute coronary syndrome per cardiology.  3. Hypertension. 4. Hyperkalemia, resolved on Kayexalate, possibly related to losartan.   5. Hyperglycemia with hemoglobin A1c of 6.5 concerning for diabetes.   DISCHARGE CONDITION: Fair.   DISCHARGE MEDICATIONS: Patient is to resume his outpatient medications which are:  Hydrochlorothiazide 25 mg p.o. daily.   ADDITIONAL MEDICATIONS:  1. New dose of Toprol-XL 50 mg p.o. daily. 2. Losartan 50 mg p.o. daily.  3. Patient is not to take Toprol-XL 25 mg p.o. daily dose.  4. Patient was also advised not to use ephedrine-based anti-congestants.   DIET: 2 grams salt, low fat, low cholesterol, carbohydrate controlled diet, regular consistency. Patient was advised to lose weight if possible as patient may be developing diabetes.  ACTIVITY LIMITATIONS: As tolerated.   FOLLOW UP: Follow-up appointment with Dr. Saralyn Pilar in two days after discharge. Holter monitor will be placed before discharging home.   HISTORY OF PRESENT ILLNESS: Patient is a 69 year old Caucasian male with past medical history significant for history of seizures, deep vein thrombosis, premature ventricular complexes who presented to the hospital with complaints of heart racing since last night. Please refer to Dr. Laurin Coder Gutierrez's admission note on 02/07/2012. Apparently patient was having racing heart as well as chest pain. He had very similar episode in the past few days ago while he was taking Sudafed for his cold symptoms for respiratory infection. Prior event increased heart rate stopped and patient was off this particular medication however, now he  again started having increased heart rate. He was also complaining of chest tightness, difficulty breathing and in the Emergency Room patient's heart rate was noted to be around 180s. He was asked to do Valsalva maneuver and his heart rate started to come down and he converted back to sinus rhythm. His shortness of breath as well as chest tightness went away. On arrival to the hospital patient's blood pressure 127/76, initially his heart rate was 179 during SVT but later on went down to 57, temperature 98.2, pulse oximetry was 98% on room air. Physical examination was unremarkable.   LABORATORY, DIAGNOSTIC AND RADIOLOGICAL DATA: Patient's EKG showed occasional PVCs but no ST depressions or elevations. Patient's lab data 02/06/2012 revealed mildly elevated BUN to 40, sodium 134, glucose 168, otherwise BMP was unremarkable, however, patient's bicarbonate level was low 19. Cardiac enzymes, first set, was unremarkable, however, on the second set he had mild elevation of troponin to 0.06 to 0.07, third set also showed normal troponin. TSH was normal at 1.95. White blood cell count was slightly elevated to 14.1, hemoglobin 16.9, platelet count 311.   HOSPITAL COURSE: Patient was admitted to the hospital for further evaluation. He was continued on metoprolol his usual medication doses. He was also evaluated and consulted by Dr. Bartholome Bill, cardiologist. Dr. Ubaldo Glassing saw patient in consultation the same day, 02/07/2012. He felt that patient had SVT, currently in sinus rhythm, rule out for myocardial infarction and remained in sinus rhythm. He recommended to advance patient's metoprolol to 50 mg p.o. daily dose and place 24 hour Holter and follow up in approximately one week with Dr. Saralyn Pilar for further recommendations. Patient's medication Toprol-XL was advanced to 50 mg p.o. daily dose and he tolerated this dose quite  well with his heart rate being in low 60s to high 50s. His blood pressure also somewhat improved with  advancement of his beta blocker. Initially his blood pressure was noted to be around 140s to 150s, however, with increased Toprol dose his blood pressure improved to 008 systolic over 67Y diastolic. Patient's oxygen saturation remained stable at 98% to 99% on room air at rest and his vital signs otherwise were unremarkable with normal temperature of 97.6, pulse as mentioned 58 to 64, and oxygen saturation 98% on room air. Patient was ambulated and as he did not have any symptoms he is being discharged home. He is to continue high doses of Toprol-XL and follow up with his primary care physician, Dr. Tobie Lords, as well as cardiologist, Dr. Saralyn Pilar, in the future.   In regards to have mildly elevated troponin, as mentioned above, just cardiac enzyme set, second cardiac enzyme set, which showed mild elevation of troponin was unlikely acute coronary syndrome according to cardiologist. No further recommendations were made. Patient had echocardiogram, however, done which showed normal cardiac function, ejection fraction of more than 55%, left atrium was noted to be mildly dilated, right ventricle was also mildly dilated. Aortic valve was trileaflet. Aortic valve opened well. Trace aortic regurgitation noted. Trace tricuspid regurgitation. Mitral valve was grossly normal. There was noted to be trace mitral regurgitation. As patient's echocardiogram was felt to be within normal limits patient is being discharged home.   For hypertension, initially it was felt that patient may benefit from advancement of his losartan, however, repeated BMP done on 02/07/2012 showed mild elevation of potassium level to 5.4. It was felt that patient's potassium level could have been related to losartan and dose was not advanced. Instead patient's metoprolol dose was advanced. Patient is to continue losartan with HCTZ only as HCTZ added to losartan would improve patient's potassium management. Patient received, however, for his hyperkalemia  some Kayexalate and his potassium level improved to 4.7 in the afternoon on 02/07/2012. It is recommended to follow patient's potassium levels as well as sodium levels which were somewhat decreased initially on arrival to the hospital and it was felt that patient's slightly low sodium level to 134 could have been related to HCTZ.   Patient was noted to be hyperglycemic. His hemoglobin A1c was checked, was found to be 6.5. Dietary education was obtained. Patient is being discharged home on diabetic diet. He was also advised to lose weight and follow up with his primary physician for recommendations and management of his diabetes   TIME SPENT: 40 minutes.   ____________________________ Theodoro Grist, MD rv:cms D: 02/07/2012 20:17:36 ET T: 02/08/2012 15:51:57 ET JOB#: [id number]  cc: Theodoro Grist, MD, <Dictator> Cyndi Bender, MD [place] MD ELECTRONICALLY SIGNED 02/14/2012 1:05

## 2014-10-30 ENCOUNTER — Ambulatory Visit
Admission: RE | Admit: 2014-10-30 | Discharge: 2014-10-30 | Disposition: A | Discharge status: Home / Self Care | Payer: Medicare Other | Source: Ambulatory Visit | Attending: Radiation Oncology | Admitting: Radiation Oncology

## 2014-10-30 ENCOUNTER — Ambulatory Visit: Payer: Medicare Other

## 2014-10-30 DIAGNOSIS — Z510 Encounter for antineoplastic radiation therapy: Secondary | ICD-10-CM | POA: Diagnosis not present

## 2014-10-31 ENCOUNTER — Ambulatory Visit: Payer: Medicare Other

## 2014-10-31 ENCOUNTER — Ambulatory Visit
Admission: RE | Admit: 2014-10-31 | Discharge: 2014-10-31 | Disposition: A | Discharge status: Home / Self Care | Payer: Medicare Other | Source: Ambulatory Visit | Attending: Radiation Oncology | Admitting: Radiation Oncology

## 2014-10-31 DIAGNOSIS — Z510 Encounter for antineoplastic radiation therapy: Secondary | ICD-10-CM | POA: Diagnosis not present

## 2014-11-01 ENCOUNTER — Ambulatory Visit
Admission: RE | Admit: 2014-11-01 | Discharge: 2014-11-01 | Disposition: A | Discharge status: Home / Self Care | Payer: Medicare Other | Source: Ambulatory Visit | Attending: Radiation Oncology | Admitting: Radiation Oncology

## 2014-11-01 ENCOUNTER — Other Ambulatory Visit (HOSPITAL_BASED_OUTPATIENT_CLINIC_OR_DEPARTMENT_OTHER): Payer: Medicare Other

## 2014-11-01 ENCOUNTER — Encounter: Payer: Self-pay | Admitting: Radiation Oncology

## 2014-11-01 ENCOUNTER — Ambulatory Visit: Payer: Medicare Other

## 2014-11-01 VITALS — BP 138/72 | HR 52 | Temp 97.7°F | Wt 220.1 lb

## 2014-11-01 DIAGNOSIS — C713 Malignant neoplasm of parietal lobe: Secondary | ICD-10-CM

## 2014-11-01 DIAGNOSIS — Z510 Encounter for antineoplastic radiation therapy: Secondary | ICD-10-CM | POA: Diagnosis not present

## 2014-11-01 LAB — CBC WITH DIFFERENTIAL/PLATELET
BASO%: 1.3 % (ref 0.0–2.0)
Basophils Absolute: 0.1 10*3/uL (ref 0.0–0.1)
EOS%: 2.1 % (ref 0.0–7.0)
Eosinophils Absolute: 0.1 10*3/uL (ref 0.0–0.5)
HEMATOCRIT: 45.7 % (ref 38.4–49.9)
HEMOGLOBIN: 14.9 g/dL (ref 13.0–17.1)
LYMPH%: 22.8 % (ref 14.0–49.0)
MCH: 29.4 pg (ref 27.2–33.4)
MCHC: 32.5 g/dL (ref 32.0–36.0)
MCV: 90.5 fL (ref 79.3–98.0)
MONO#: 0.6 10*3/uL (ref 0.1–0.9)
MONO%: 13.1 % (ref 0.0–14.0)
NEUT#: 2.9 10*3/uL (ref 1.5–6.5)
NEUT%: 60.7 % (ref 39.0–75.0)
Platelets: 224 10*3/uL (ref 140–400)
RBC: 5.05 10*6/uL (ref 4.20–5.82)
RDW: 13.6 % (ref 11.0–14.6)
WBC: 4.8 10*3/uL (ref 4.0–10.3)
lymph#: 1.1 10*3/uL (ref 0.9–3.3)

## 2014-11-01 NOTE — Progress Notes (Signed)
Department of Radiation Oncology  Phone:  [phone] Fax:        [phone]  Weekly Treatment Note    Name: Ricky Montgomery Date: 11/01/2014 MRN: [id number] DOB: [birth year]   Current dose: 50 Gy  Current fraction: 25   MEDICATIONS: Current Outpatient Prescriptions  Medication Sig Dispense Refill  . ALPRAZolam (XANAX) 0.5 MG tablet Take 0.5 mg by mouth 2 (two) times daily.     [name] Kitchen amLODipine (NORVASC) 10 MG tablet Take 1 tablet (10 mg total) by mouth daily. 30 tablet 8  . aspirin EC 81 MG tablet Take 81 mg by mouth daily.    [name] Kitchen b complex vitamins tablet Take 1 tablet by mouth daily.    . cetirizine (ZYRTEC) 10 MG tablet Take 10 mg by mouth daily as needed. For allergies    . emollient (BIAFINE) cream Apply 1 application topically daily. Apply to affected treated area daily starting  after irritation or when itching occurs    . Lacosamide 100 MG TABS Take 50 mg by mouth 2 (two) times daily. tapered    . levETIRAcetam (KEPPRA) 1000 MG tablet Take 1,000 mg by mouth 2 (two) times daily.  3  . Magnesium 250 MG TABS Take 750 mg by mouth daily.    . ondansetron (ZOFRAN) 8 MG tablet Take 1 tablet (8 mg total) by mouth 3 (three) times daily as needed for nausea or vomiting. 30 tablet 3  . oxyCODONE (OXY IR/ROXICODONE) 5 MG immediate release tablet Take 5 mg by mouth every 4 (four) hours as needed.    . sertraline (ZOLOFT) 50 MG tablet Take 50 mg by mouth daily.     [name] Kitchen sulfamethoxazole-trimethoprim (BACTRIM DS,SEPTRA DS) 800-160 MG per tablet Take 1 tablet by mouth 2 (two) times daily. Mon- Wed-Thu 30 tablet 3  . temozolomide (TEMODAR) 140 MG capsule Take 1 capsule (140 mg total) by mouth daily. May take on an empty stomach or at bedtime to decrease nausea & vomiting. 47 capsule 0  . traZODone (DESYREL) 50 MG tablet Take 50 mg by mouth at bedtime.      No current facility-administered medications for this encounter.     ALLERGIES: Prednisone   LABORATORY DATA:  Lab Results    Component Value Date   WBC 4.8 11/01/2014   HGB 14.9 11/01/2014   HCT 45.7 11/01/2014   MCV 90.5 11/01/2014   PLT 224 11/01/2014   Lab Results  Component Value Date   NA 138 10/25/2014   K 4.3 10/25/2014   CL 102 04/19/2012   CO2 23 10/25/2014   Lab Results  Component Value Date   ALT 26 10/25/2014   AST 27 10/25/2014   ALKPHOS 68 10/25/2014   BILITOT 0.80 10/25/2014     NARRATIVE: Ricky Montgomery was seen today for weekly treatment management. The chart was checked and the patient's films were reviewed.  Weekly assessment of radiation to brain for glioblastoma.completed 25 of 30 fractions.Intermittent mild headache.Denies nausea,Definite hair loss.skin looks good.Overall doing well except for increased fatigue. The patient is doing excellent. No substantial change over the last week.  PHYSICAL EXAMINATION: weight is 220 lb 1.6 oz (99.837 kg). His temperature is 97.7 F (36.5 C). His blood pressure is 138/72 and his pulse is 52. His oxygen saturation is 98%.      some alopecia posteriorly in the scalp  ASSESSMENT: The patient is doing satisfactorily with treatment.  PLAN: We will continue with the patient's radiation treatment as planned.  I'm very pleased  with how the patient is doing. I will see him 1 month after completion of his radiation treatment.

## 2014-11-01 NOTE — Progress Notes (Signed)
Weekly assessment of radiation to brain for glioblastoma.completed 25 of 30 fractions.Intermittent mild headache.Denies nausea,Definite hair loss.skin looks good.Overall doing well except for increased fatigue.

## 2014-11-04 ENCOUNTER — Ambulatory Visit
Admission: RE | Admit: 2014-11-04 | Discharge: 2014-11-04 | Disposition: A | Discharge status: Home / Self Care | Payer: Medicare Other | Source: Ambulatory Visit | Attending: Radiation Oncology | Admitting: Radiation Oncology

## 2014-11-04 DIAGNOSIS — Z510 Encounter for antineoplastic radiation therapy: Secondary | ICD-10-CM | POA: Diagnosis not present

## 2014-11-05 ENCOUNTER — Ambulatory Visit
Admission: RE | Admit: 2014-11-05 | Discharge: 2014-11-05 | Disposition: A | Discharge status: Home / Self Care | Payer: Medicare Other | Source: Ambulatory Visit | Attending: Radiation Oncology | Admitting: Radiation Oncology

## 2014-11-05 DIAGNOSIS — Z510 Encounter for antineoplastic radiation therapy: Secondary | ICD-10-CM | POA: Diagnosis not present

## 2014-11-06 ENCOUNTER — Ambulatory Visit
Admission: RE | Admit: 2014-11-06 | Discharge: 2014-11-06 | Disposition: A | Discharge status: Home / Self Care | Payer: Medicare Other | Source: Ambulatory Visit | Attending: Radiation Oncology | Admitting: Radiation Oncology

## 2014-11-06 DIAGNOSIS — Z510 Encounter for antineoplastic radiation therapy: Secondary | ICD-10-CM | POA: Diagnosis not present

## 2014-11-07 ENCOUNTER — Ambulatory Visit
Admission: RE | Admit: 2014-11-07 | Discharge: 2014-11-07 | Disposition: A | Discharge status: Home / Self Care | Payer: Medicare Other | Source: Ambulatory Visit | Attending: Radiation Oncology | Admitting: Radiation Oncology

## 2014-11-07 DIAGNOSIS — Z510 Encounter for antineoplastic radiation therapy: Secondary | ICD-10-CM | POA: Diagnosis not present

## 2014-11-07 NOTE — Progress Notes (Signed)
Weekly Management Note Current Dose:60 Gy  Projected Dose:60 Gy   Narrative:  The patient presents for routine under treatment assessment.  CBCT/MVCT images/Port film x-rays were reviewed.  The chart was checked.Finishes RT today. No headaches. Significant fatigue. Head hurst on the right side when he reads too long. Vision continues to be double. Appt with Dr. Lindi Adie following this. Not on decadron. No seizures.   Physical Findings:  Dry/pink scalp. Hair is intact.   Vitals: There were no vitals filed for this visit. Weight:  Wt Readings from Last 3 Encounters:  11/01/14 220 lb 1.6 oz (99.837 kg)  10/25/14 218 lb 11.2 oz (99.202 kg)  10/18/14 220 lb 6.4 oz (99.973 kg)   Lab Results  Component Value Date   WBC 4.8 11/01/2014   HGB 14.9 11/01/2014   HCT 45.7 11/01/2014   MCV 90.5 11/01/2014   PLT 224 11/01/2014   Lab Results  Component Value Date   CREATININE 1.1 10/25/2014   BUN 16.9 10/25/2014   NA 138 10/25/2014   K 4.3 10/25/2014   CL 102 04/19/2012   CO2 23 10/25/2014     Impression:  The patient is tolerating radiation.  Plan: Follow up in 1 month. Scans per Dr. Lisbeth Renshaw. Call with questions. Discussed fatigue.

## 2014-11-08 ENCOUNTER — Telehealth: Payer: Self-pay | Admitting: Hematology and Oncology

## 2014-11-08 ENCOUNTER — Other Ambulatory Visit (HOSPITAL_BASED_OUTPATIENT_CLINIC_OR_DEPARTMENT_OTHER): Payer: Medicare Other

## 2014-11-08 ENCOUNTER — Other Ambulatory Visit: Payer: Medicare Other

## 2014-11-08 ENCOUNTER — Ambulatory Visit
Admission: RE | Admit: 2014-11-08 | Discharge: 2014-11-08 | Disposition: A | Discharge status: Home / Self Care | Payer: Medicare Other | Source: Ambulatory Visit | Attending: Radiation Oncology | Admitting: Radiation Oncology

## 2014-11-08 ENCOUNTER — Encounter: Payer: Self-pay | Admitting: Radiation Oncology

## 2014-11-08 ENCOUNTER — Ambulatory Visit (HOSPITAL_BASED_OUTPATIENT_CLINIC_OR_DEPARTMENT_OTHER): Payer: Medicare Other | Admitting: Hematology and Oncology

## 2014-11-08 VITALS — BP 147/75 | HR 57 | Temp 97.5°F | Resp 20 | Wt 218.5 lb

## 2014-11-08 VITALS — BP 131/73 | HR 56 | Temp 97.5°F | Resp 18 | Ht 74.0 in | Wt 217.3 lb

## 2014-11-08 DIAGNOSIS — H532 Diplopia: Secondary | ICD-10-CM | POA: Diagnosis not present

## 2014-11-08 DIAGNOSIS — C713 Malignant neoplasm of parietal lobe: Secondary | ICD-10-CM

## 2014-11-08 DIAGNOSIS — Z923 Personal history of irradiation: Secondary | ICD-10-CM

## 2014-11-08 DIAGNOSIS — Z510 Encounter for antineoplastic radiation therapy: Secondary | ICD-10-CM | POA: Diagnosis not present

## 2014-11-08 HISTORY — DX: Personal history of irradiation: Z92.3

## 2014-11-08 LAB — CBC WITH DIFFERENTIAL/PLATELET
BASO%: 1.1 % (ref 0.0–2.0)
BASOS ABS: 0.1 10*3/uL (ref 0.0–0.1)
EOS%: 2.4 % (ref 0.0–7.0)
Eosinophils Absolute: 0.1 10*3/uL (ref 0.0–0.5)
HCT: 46.8 % (ref 38.4–49.9)
HEMOGLOBIN: 15.3 g/dL (ref 13.0–17.1)
LYMPH#: 1 10*3/uL (ref 0.9–3.3)
LYMPH%: 20.3 % (ref 14.0–49.0)
MCH: 29.7 pg (ref 27.2–33.4)
MCHC: 32.8 g/dL (ref 32.0–36.0)
MCV: 90.6 fL (ref 79.3–98.0)
MONO#: 0.5 10*3/uL (ref 0.1–0.9)
MONO%: 9.9 % (ref 0.0–14.0)
NEUT%: 66.3 % (ref 39.0–75.0)
NEUTROS ABS: 3.1 10*3/uL (ref 1.5–6.5)
Platelets: 223 10*3/uL (ref 140–400)
RBC: 5.16 10*6/uL (ref 4.20–5.82)
RDW: 13.6 % (ref 11.0–14.6)
WBC: 4.7 10*3/uL (ref 4.0–10.3)

## 2014-11-08 NOTE — Progress Notes (Signed)
Patient Care Team: Suzan Garibaldi, FNP as PCP - General (Nurse Practitioner)  DIAGNOSIS: No matching staging information was found for the patient.  SUMMARY OF ONCOLOGIC HISTORY:   Glioblastoma multiforme of parietal lobe   07/29/2014 Initial Diagnosis Glioblastoma multiforme of parietal lobe: Craniotomy and resection of the tumor at [facility] with no residual enhancement on postop MRI   09/30/2014 - 11/08/2014 Radiation Therapy Temodar with radiation adjuvant therapy    CHIEF COMPLIANT:  Completed radiation therapy with adjuvant Temodar  INTERVAL HISTORY: Ricky Montgomery is a  69 year old gentleman with above-mentioned history of glioblastoma deformity no underwent surgery and has just completed Temodar with radiation as of today. He reports that he tolerated Temodar extremely well without any major problems or concerns. He denies any nausea or vomiting. His blood counts have been very stable.  REVIEW OF SYSTEMS:   Constitutional: Denies fevers, chills or abnormal weight loss Eyes:  Diminished vision and diplopia Ears, nose, mouth, throat, and face: Denies mucositis or sore throat Respiratory: Denies cough, dyspnea or wheezes Cardiovascular: Denies palpitation, chest discomfort or lower extremity swelling Gastrointestinal:  Denies nausea, heartburn or change in bowel habits Skin: Denies abnormal skin rashes Lymphatics: Denies new lymphadenopathy or easy bruising Neurological:Denies numbness, tingling or new weaknesses Behavioral/Psych: Mood is stable, no new changes   All other systems were reviewed with the patient and are negative.  I have reviewed the past medical history, past surgical history, social history and family history with the patient and they are unchanged from previous note.  ALLERGIES:  is allergic to prednisone.  MEDICATIONS:  Current Outpatient Prescriptions  Medication Sig Dispense Refill  . ALPRAZolam (XANAX) 0.5 MG tablet Take 0.5 mg by mouth 2 (two) times  daily.     [name] Kitchen amLODipine (NORVASC) 10 MG tablet Take 1 tablet (10 mg total) by mouth daily. 30 tablet 8  . aspirin EC 81 MG tablet Take 81 mg by mouth daily.    [name] Kitchen b complex vitamins tablet Take 1 tablet by mouth daily.    . cetirizine (ZYRTEC) 10 MG tablet Take 10 mg by mouth daily as needed. For allergies    . emollient (BIAFINE) cream Apply 1 application topically daily. Apply to affected treated area daily starting  after irritation or when itching occurs    . Lacosamide 100 MG TABS Take 50 mg by mouth 2 (two) times daily. tapered    . levETIRAcetam (KEPPRA) 1000 MG tablet Take 1,000 mg by mouth 2 (two) times daily.  3  . Magnesium 250 MG TABS Take 750 mg by mouth daily.    . ondansetron (ZOFRAN) 8 MG tablet Take 1 tablet (8 mg total) by mouth 3 (three) times daily as needed for nausea or vomiting. 30 tablet 3  . oxyCODONE (OXY IR/ROXICODONE) 5 MG immediate release tablet Take 5 mg by mouth every 4 (four) hours as needed.    . sertraline (ZOLOFT) 50 MG tablet Take 50 mg by mouth daily.     [name] Kitchen sulfamethoxazole-trimethoprim (BACTRIM DS,SEPTRA DS) 800-160 MG per tablet Take 1 tablet by mouth 2 (two) times daily. Mon- Wed-Thu 30 tablet 3  . traZODone (DESYREL) 50 MG tablet Take 50 mg by mouth at bedtime.      No current facility-administered medications for this visit.    PHYSICAL EXAMINATION: ECOG PERFORMANCE STATUS: 1 - Symptomatic but completely ambulatory  Filed Vitals:   11/08/14 0913  BP: 131/73  Pulse: 56  Temp: 97.5 F (36.4 C)  Resp: 18   Filed  Weights   11/08/14 0913  Weight: 217 lb 4.8 oz (98.567 kg)    GENERAL:alert, no distress and comfortable SKIN: skin color, texture, turgor are normal, no rashes or significant lesions EYES: normal, Conjunctiva are pink and non-injected, sclera clear OROPHARYNX:no exudate, no erythema and lips, buccal mucosa, and tongue normal  NECK: supple, thyroid normal size, non-tender, without nodularity LYMPH:  no palpable lymphadenopathy in  the cervical, axillary or inguinal LUNGS: clear to auscultation and percussion with normal breathing effort HEART: regular rate & rhythm and no murmurs and no lower extremity edema ABDOMEN:abdomen soft, non-tender and normal bowel sounds Musculoskeletal:no cyanosis of digits and no clubbing  NEURO: alert & oriented x 3 with fluent speech  LABORATORY DATA:  I have reviewed the data as listed   Chemistry      Component Value Date/Time   NA 138 10/25/2014 0812   NA 137 04/19/2012 0924   NA 137 02/07/2012 0734   K 4.3 10/25/2014 0812   K 4.2 04/19/2012 0924   K 4.7 02/07/2012 1409   CL 102 04/19/2012 0924   CL 104 02/07/2012 0734   CO2 23 10/25/2014 0812   CO2 28 04/19/2012 0924   CO2 26 02/07/2012 0734   BUN 16.9 10/25/2014 0812   BUN 20 04/19/2012 0924   BUN 35* 02/07/2012 0734   CREATININE 1.1 10/25/2014 0812   CREATININE 1.1 04/19/2012 0924   CREATININE 1.11 02/07/2012 0734      Component Value Date/Time   CALCIUM 8.2* 10/25/2014 0812   CALCIUM 9.5 04/19/2012 0924   CALCIUM 8.7 02/07/2012 0734   ALKPHOS 68 10/25/2014 0812   AST 27 10/25/2014 0812   ALT 26 10/25/2014 0812   BILITOT 0.80 10/25/2014 0812       Lab Results  Component Value Date   WBC 4.7 11/08/2014   HGB 15.3 11/08/2014   HCT 46.8 11/08/2014   MCV 90.6 11/08/2014   PLT 223 11/08/2014   NEUTROABS 3.1 11/08/2014   ASSESSMENT & PLAN:  Glioblastoma multiforme of parietal lobe Right parietal lobe glioblastoma multiforme: Status post complete resection of a 3.4 cm peripherally enhancing mass within the right parieto-occipital lobe through a craniotomy at [facility] on 07/29/2014 with the postoperative brain MRI showing no residual enhancement.  Current treatment: Temodar with radiation (with prophylactic Bactrim) started 09/30/2014 completed 11/08/2014 Temozolomide toxicities:  1. Mild nausea on day 1 but no further problems with nausea  2. Blood counts have been  reviewed and they're normal Patient will start maintenance temozolomide from  June 1 and will do that  D1 to day 5  Once a month.  The dosage will be 150 mg/m for cycle 1 and then it would be 200 mg/m for cycle 2 to 6.  They have enough medicine left over from the concurrent therapy 2 last for the first cycle. I instructed that they take to 80 mg dosage for the first cycle. From cycle 2 on Versed dosage will be  500 mg.  Neurology consult:Patient requested if neurology can see him closer to home. He has been seeing neurology at [facility]. I would make a consultation request to Dr. Charlesetta Shanks  With [facility] neurology regarding neurology follow-up closer to home.  Diplopia:He plans to see an ophthalmologist regarding his eye problems including diplopia and diminished vision. 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 Monitoring closely for side effects. Return to clinic in  2 months for follow-up   Orders Placed This Encounter  Procedures  .  Ambulatory referral to Neurology    Referral Priority:  Routine    Referral Type:  Consultation    Referral Reason:  Specialty Services Required    Referred to Provider:  Penni Bombard, MD    Requested Specialty:  Neurology    Number of Visits Requested:  1   The patient has a good understanding of the overall plan. he agrees with it. he will call with any problems that may develop before his next visit here.   Rulon Eisenmenger, MD

## 2014-11-08 NOTE — Progress Notes (Signed)
Weekly rad txs  Brain 30/30 completed, erythme ,dryness back of head, uses biafine bid, slight head ache  Right side head, says double visiion when trying to read, not new, no pain or nausea, takes nausea med at night though, weakness, ambulates with cane, 1 month f/u appt card given BP 147/75 mmHg  Pulse 57  Temp(Src) 97.5 F (36.4 C) (Oral)  Resp 20  Wt 218 lb 8 oz (99.111 kg)  SpO2 97%  Wt Readings from Last 3 Encounters:  11/08/14 218 lb 8 oz (99.111 kg)  11/01/14 220 lb 1.6 oz (99.837 kg)  10/25/14 218 lb 11.2 oz (99.202 kg)  8:45 AM

## 2014-11-08 NOTE — Assessment & Plan Note (Signed)
Right parietal lobe glioblastoma multiforme: Status post complete resection of a 3.4 cm peripherally enhancing mass within the right parieto-occipital lobe through a craniotomy at [facility] on 07/29/2014 with the postoperative brain MRI showing no residual enhancement.  Current treatment: Temodar with radiation (with prophylactic Bactrim) started 09/30/2014 Temozolomide toxicities:  1. Mild nausea on day 1 but no further problems with nausea  2. Blood counts have been reviewed and they're normal  Monitoring closely for side effects. Return to clinic in 3 weeks for follow-up

## 2014-11-08 NOTE — Telephone Encounter (Signed)
per pof to sch pt appt-gave pt copy of sch-cld Dr Leta Baptist office to sch-they req Medical records to be faxed to [phone] sent email to Tiffany/Kim to fax

## 2014-11-11 ENCOUNTER — Telehealth: Payer: Self-pay | Admitting: Hematology and Oncology

## 2014-11-11 NOTE — Telephone Encounter (Signed)
Faxed pt medical records to Dr. Leta Baptist

## 2014-11-15 ENCOUNTER — Other Ambulatory Visit: Payer: Medicare Other

## 2014-11-18 ENCOUNTER — Encounter: Payer: Self-pay | Admitting: Diagnostic Neuroimaging

## 2014-11-18 ENCOUNTER — Ambulatory Visit (INDEPENDENT_AMBULATORY_CARE_PROVIDER_SITE_OTHER): Payer: Medicare Other | Admitting: Diagnostic Neuroimaging

## 2014-11-18 VITALS — BP 148/82 | HR 56 | Ht 74.0 in | Wt 219.8 lb

## 2014-11-18 DIAGNOSIS — C719 Malignant neoplasm of brain, unspecified: Secondary | ICD-10-CM | POA: Diagnosis not present

## 2014-11-18 NOTE — Progress Notes (Signed)
[facility]  PATIENT: Ricky Montgomery DOB: 08-17-1945  REFERRING CLINICIAN: Gudena HISTORY FROM: patient and wife  REASON FOR VISIT: new consult    HISTORICAL  CHIEF COMPLAINT:  Chief Complaint  Patient presents with  . New Evaluation    glioblastoma    HISTORY OF PRESENT ILLNESS:   69 year old right-handed male here for evaluation of glioblastoma multiforme. September 2015 patient started with headaches, seeing fanlike patterns in his perforation, lasting up to one hour at a time. Patient has had some episodes of staring as well. In January 2016, patient had confusion, inability to speak, got lost while driving his truck, was taken to the hospital and found to have a right parietal mass. He was transferred to [facility] and treated with surgical resection, with pathologically proven glioblastoma multiform. MGMT analysis was negative for methylation, which is associated with a worse prognosis. Patient was treated with temozolomide and 6 weeks of radiation therapy. Last radiation treatment was on 11/08/14.  Patient was also treated with levetiracetam and Vimpat for seizure control. Vimpat caused side effects and had significant cost and therefore he is maintained on levetiracetam 1000 twice a day. No further seizures. He is tolerating his medication well.   REVIEW OF SYSTEMS: Full 14 system review of systems performed and notable only for allergies memory loss confusion weakness blurred vision double vision loss of vision eye pain fatigue hearing loss only in legs ringing in ears.   ALLERGIES: Allergies  Allergen Reactions  . Prednisone Palpitations    HOME MEDICATIONS: Outpatient Prescriptions Prior to Visit  Medication Sig Dispense Refill  . ALPRAZolam (XANAX) 0.5 MG tablet Take 0.5 mg by mouth 2 (two) times daily.     [name] Kitchen amLODipine (NORVASC) 10 MG tablet Take 1 tablet (10 mg total) by mouth daily. 30 tablet 8  . aspirin EC 81 MG tablet Take 81 mg by  mouth daily.    . cetirizine (ZYRTEC) 10 MG tablet Take 10 mg by mouth daily as needed. For allergies    . levETIRAcetam (KEPPRA) 1000 MG tablet Take 1,000 mg by mouth 2 (two) times daily.  3  . ondansetron (ZOFRAN) 8 MG tablet Take 1 tablet (8 mg total) by mouth 3 (three) times daily as needed for nausea or vomiting. 30 tablet 3  . oxyCODONE (OXY IR/ROXICODONE) 5 MG immediate release tablet Take 5 mg by mouth every 4 (four) hours as needed.    . sertraline (ZOLOFT) 50 MG tablet Take 50 mg by mouth daily.     [name] Kitchen sulfamethoxazole-trimethoprim (BACTRIM DS,SEPTRA DS) 800-160 MG per tablet Take 1 tablet by mouth 2 (two) times daily. Mon- Wed-Thu 30 tablet 3  . traZODone (DESYREL) 50 MG tablet Take 50 mg by mouth at bedtime.     [name] Kitchen b complex vitamins tablet Take 1 tablet by mouth daily.    [name] Kitchen emollient (BIAFINE) cream Apply 1 application topically daily. Apply to affected treated area daily starting  after irritation or when itching occurs    . Magnesium 250 MG TABS Take 750 mg by mouth daily.    . Lacosamide 100 MG TABS Take 50 mg by mouth 2 (two) times daily. tapered     No facility-administered medications prior to visit.    PAST MEDICAL HISTORY: Past Medical History  Diagnosis Date  . Asthma   . GERD (gastroesophageal reflux disease)   . PVC (premature ventricular contraction)   . Thyroid disease     hypothyroidism  . Hypertension   . DVT (  deep venous thrombosis)     L leg 30 years ago  . TIA (transient ischemic attack) 4 years ago  . SVT (supraventricular tachycardia)     Hx of RFA  . Refusal of blood transfusions as patient is Jehovah's Witness   . Seizures     YEARS AGO"  . Dyslipidemia   . Normal coronary arteries Oct 2013  . Weight loss, non-intentional 07/31/2013  . Allergy   . 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 Anxiety   . Neuromuscular disorder     left calf, s/p DVT   . 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 Thyroidectomy, partial  1946  . 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 Shoulder surgery      FRACTURE  . Cardiac catheterization  04/14/2013    normal coronary arteries and normal left ventricular function.  . Left heart catheterization with coronary angiogram N/A 04/14/2012    Procedure: LEFT HEART CATHETERIZATION WITH CORONARY ANGIOGRAM;  Surgeon: Lorretta Harp, MD;  Location: [facility] CATH LAB;  Service: Cardiovascular;  Laterality: N/A;  . Supraventricular tachycardia ablation N/A 04/27/2012    Procedure: SUPRAVENTRICULAR TACHYCARDIA ABLATION;  Surgeon: Thompson Grayer, MD;  Location: [facility] CATH LAB;  Service: Cardiovascular;  Laterality: N/A;    FAMILY HISTORY: Family History  Problem Relation Age of Onset  . Alcohol abuse Other     Grandparent  . Leukemia Brother 23    SOCIAL HISTORY:  History   Social History  . Marital Status: Married    Spouse Name: Marcie Bal  . Number of Children: 3  . Years of Education: 11   Occupational History  . Retired     Games developer   Social History Main Topics  . Smoking status: Former Smoker -- 0.50 packs/day for 5 years    Types: Cigarettes    Quit date: 07/12/1964  . Smokeless tobacco: Never Used     Comment: smoked form age [age], none since  . Alcohol Use: No  . Drug Use: No  . Sexual Activity: Not Currently   Other Topics Concern  . Not on file   Social History Narrative   Pt lives in [place] with spouse.   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      Has many grandkids      PHYSICAL EXAM  Filed Vitals:   11/18/14 0849  BP: 148/82  Pulse: 56  Height: 6\' 2"  (1.88 m)  Weight: 219 lb 12.8 oz (99.701 kg)    Body mass index is 28.21 kg/(m^2).  No exam data present  No flowsheet data found.  GENERAL EXAM: Patient is in no distress; well developed, nourished and groomed; neck  is supple  CARDIOVASCULAR: Regular rate and rhythm, no murmurs, no carotid bruits  NEUROLOGIC: MENTAL STATUS: awake, alert, oriented to person, place and time, recent and remote memory intact, normal attention and concentration, language fluent, comprehension intact, naming intact, fund of knowledge appropriate CRANIAL NERVE: no papilledema on fundoscopic exam, pupils equal and reactive to light, visual fields DECR ON LEFT SIDE  (HOMONYMOUS HEMIANOPSIA), full to confrontation, extraocular muscles intact, no nystagmus, facial sensation and strength symmetric EXCEPT LEFT LOWER FACIAL ASYMMETRY AT REST (RESTS LOWER THAN RIGHT SIDE, FROM BIRTH PER PATIENT),  hearing  intact, palate elevates symmetrically, uvula midline, shoulder shrug symmetric, tongue midline. HOARSE VOICE.  MOTOR: normal bulk and tone, full strength in the BUE, BLE SENSORY: normal and symmetric to light touch, pinprick, temperature, vibration; NO EXTINCTION COORDINATION: finger-nose-finger, fine finger movements, heel-shin normal REFLEXES: deep tendon reflexes present and symmetric GAIT/STATION: narrow based gait; romberg is negative    DIAGNOSTIC DATA (LABS, IMAGING, TESTING) - I reviewed patient records, labs, notes, testing and imaging myself where available.  Lab Results  Component Value Date   WBC 4.7 11/08/2014   HGB 15.3 11/08/2014   HCT 46.8 11/08/2014   MCV 90.6 11/08/2014   PLT 223 11/08/2014      Component Value Date/Time   NA 138 10/25/2014 0812   NA 137 04/19/2012 0924   NA 137 02/07/2012 0734   K 4.3 10/25/2014 0812   K 4.2 04/19/2012 0924   K 4.7 02/07/2012 1409   CL 102 04/19/2012 0924   CL 104 02/07/2012 0734   CO2 23 10/25/2014 0812   CO2 28 04/19/2012 0924   CO2 26 02/07/2012 0734   GLUCOSE 103 10/25/2014 0812   GLUCOSE 96 04/19/2012 0924   GLUCOSE 112* 02/07/2012 0734   BUN 16.9 10/25/2014 0812   BUN 20 04/19/2012 0924   BUN 35* 02/07/2012 0734   CREATININE 1.1 10/25/2014 0812    CREATININE 1.1 04/19/2012 0924   CREATININE 1.11 02/07/2012 0734   CALCIUM 8.2* 10/25/2014 0812   CALCIUM 9.5 04/19/2012 0924   CALCIUM 8.7 02/07/2012 0734   PROT 6.9 10/25/2014 0812   ALBUMIN 4.2 10/25/2014 0812   AST 27 10/25/2014 0812   ALT 26 10/25/2014 0812   ALKPHOS 68 10/25/2014 0812   BILITOT 0.80 10/25/2014 0812   GFRNONAA 71* 04/14/2012 1117   GFRNONAA >60 02/07/2012 0734   GFRAA 82* 04/14/2012 1117   GFRAA >60 02/07/2012 0734   No results found for: CHOL, HDL, LDLCALC, LDLDIRECT, TRIG, CHOLHDL No results found for: HGBA1C No results found for: VITAMINB12 Lab Results  Component Value Date   TSH 0.69 02/04/2012    07/24/14 MRI brain - peripherally enhancing mass measuring up to 3.5 cm with small adjacent satellite lesion within the right occipital lobe. Findings likely represent high-grade glioma or metastasis.  07/30/14 MRI brain - Post surgical changes from right parieto-occipital mass resection. No residual enhancement. Small right convexity subdural hematoma and pneumocephalus.     ASSESSMENT AND PLAN  69 y.o. year old male here with right parieto-occipital glioblastoma multiform, status post resection, temozolomide and radiation therapy. Also with possible visual seizures at onset, now under control with levetiracetam.  Dx: right parietal glioblastoma multiforme  PLAN: - continue LEV 1000mg  BID for seizure prevention - continue temodar per oncology (Dr. Lindi Adie) - recommend surveillance MRI brain studies, 4 weeks after radiation therapy completed (11/08/14), then every 3 months thereafter; this could be done at [facility] for better continuity / comparison, or done here locally; will defer to Dr. Lindi Adie for mgmt  Return in about 3 months (around 02/18/2015).    Penni Bombard, MD 0/12/3014, 01:09 AM Certified in Neurology, Neurophysiology and Neuroimaging  [facility] [address], [place], [place] [address] [phone]

## 2014-11-18 NOTE — Patient Instructions (Addendum)
Continue current medications.   Repeat MRI brain in June 2016.

## 2014-12-03 ENCOUNTER — Encounter: Payer: Self-pay | Admitting: Radiation Oncology

## 2014-12-11 ENCOUNTER — Ambulatory Visit
Admission: RE | Admit: 2014-12-11 | Discharge: 2014-12-11 | Disposition: A | Discharge status: Home / Self Care | Payer: Medicare Other | Source: Ambulatory Visit | Attending: Radiation Oncology | Admitting: Radiation Oncology

## 2014-12-11 ENCOUNTER — Telehealth: Payer: Self-pay | Admitting: *Deleted

## 2014-12-11 HISTORY — DX: Personal history of irradiation: Z92.3

## 2014-12-11 NOTE — Telephone Encounter (Signed)
Called wife phone per her request,pt hoh, patient had 1015 am appt f/u, left voice message asking if they were on their way ,runnng late ,to please call back 10:30 AM

## 2014-12-13 ENCOUNTER — Ambulatory Visit
Admission: RE | Admit: 2014-12-13 | Discharge: 2014-12-13 | Disposition: A | Discharge status: Home / Self Care | Payer: Medicare Other | Source: Ambulatory Visit | Attending: Radiation Oncology | Admitting: Radiation Oncology

## 2014-12-13 ENCOUNTER — Encounter: Payer: Self-pay | Admitting: Radiation Oncology

## 2014-12-13 VITALS — BP 149/68 | HR 56 | Temp 97.7°F | Resp 20 | Ht 74.0 in | Wt 223.0 lb

## 2014-12-13 DIAGNOSIS — C713 Malignant neoplasm of parietal lobe: Secondary | ICD-10-CM

## 2014-12-13 NOTE — Progress Notes (Signed)
  Radiation Oncology         [phone] ________________________________  Name: Ricky Montgomery MRN: [id number]  Date: 09/11/2014  DOB: 13-May-1946  SIMULATION AND TREATMENT PLANNING NOTE  DIAGNOSIS:  Glioblastoma multiform   NARRATIVE:  The patient was brought to the [facility] suite.  Identity was confirmed.  All relevant records and images related to the planned course of therapy were reviewed.   Written consent to proceed with treatment was confirmed which was freely given after reviewing the details related to the planned course of therapy had been reviewed with the patient.  Then, the patient was set-up in a stable reproducible  supine position for radiation therapy.  CT images were obtained.  Surface markings were placed.    Medically necessary complex treatment device(s) for immobilization:  Customized thermoplastic head chest.   The CT images were loaded into the planning software.  Then the target and avoidance structures were contoured.  Treatment planning then occurred.  The radiation prescription was entered and confirmed.   I have requested : Intensity Modulated Radiotherapy (IMRT) is medically necessary for this case for the following reason:  Critical CNS structure avoidance - brainstem, optic chiasm, optic nerve.[name] Kitchen   PLAN:  The patient will receive 60 Gy in 30 fractions. The patient initially will receive 46 gray in 23 fractions and then will receive a 14 gray boost.  Special treatment procedure The patient will also receive concurrent chemotherapy during the treatment. The patient may therefore experience increased toxicity or side effects and the patient will be monitored for such problems. This may require extra lab work as necessary. This therefore constitutes a special treatment procedure.  ________________________________   Jodelle Gross, MD, PhD

## 2014-12-13 NOTE — Progress Notes (Addendum)
Follow up s/p whole brain radiation  For glioblastoma, compleetd 11/08/14, still has a dull head ache 2/10 scale chronic, started on Temodar 140mg  on 12/11/14, (2 tabs 280mg  for 5 days) then 280mg  in July for 5 days every month x 1 year, appetite good, still on Keppra , no decadron, walks with a cane,slight off balance, energy level not good, mTV centrum silver daily takes, occasionally dizzy stated last week took meclizine for 2 days then resolved BP 149/68 mmHg  Pulse 56  Temp(Src) 97.7 F (36.5 C) (Oral)  Resp 20  Ht 6\' 2"  (1.88 m)  Wt 223 lb (101.152 kg)  BMI 28.62 kg/m2  SpO2 99%  Wt Readings from Last 3 Encounters:  11/18/14 219 lb 12.8 oz (99.701 kg)  11/08/14 217 lb 4.8 oz (98.567 kg)  11/08/14 218 lb 8 oz (99.111 kg)   8:05 AM

## 2014-12-13 NOTE — Progress Notes (Signed)
Radiation Oncology         [phone] ________________________________  Name: Ricky Montgomery MRN: [id number]  Date: 12/13/2014  DOB: June 09, 1946  Follow-Up Visit Note  CC: Suzan Garibaldi, FNP  Trey Paula, MD  Diagnosis:      Glioblastoma multiforme of parietal lobe   07/29/2014 Initial Diagnosis Glioblastoma multiforme of parietal lobe: Craniotomy and resection of the tumor at [facility] with no residual enhancement on postop MRI   09/30/2014 - 11/08/2014 Radiation Therapy Temodar with radiation adjuvant therapy      Narrative:  The patient returns today for routine follow-up.  The patient states that he is doing well. He is continuing with ongoing Temodar. He began his monthly Temodar this month and is scheduled to see medical oncology towards the end of the month, possibly with dose increase. Overall he looks good today. He states that he is experiencing some significant fatigue which is his dominant complaint. Ongoing headaches which have been lower grade. The patient states that he feels that his headaches have likely increased to some degree and he typically rates his headaches as a 2 out of 10. He states that he has some difficulty with enunciation especially when he gets tired which may be slightly worse at this time as well.                              ALLERGIES:  is allergic to prednisone.  Meds: Current Outpatient Prescriptions  Medication Sig Dispense Refill  . ALPRAZolam (XANAX) 0.5 MG tablet Take 0.5 mg by mouth 2 (two) times daily.     [name] Kitchen amLODipine (NORVASC) 10 MG tablet Take 1 tablet (10 mg total) by mouth daily. 30 tablet 8  . aspirin EC 81 MG tablet Take 81 mg by mouth daily.    . cetirizine (ZYRTEC) 10 MG tablet Take 10 mg by mouth daily as needed. For allergies    . emollient (BIAFINE) cream Apply 1 application topically daily. Apply to affected treated area daily starting  after irritation or when itching occurs    . levETIRAcetam (KEPPRA) 1000 MG tablet Take  1,000 mg by mouth 2 (two) times daily.  3  . Magnesium 250 MG TABS Take 750 mg by mouth daily.    . meclizine (ANTIVERT) 25 MG tablet Take 25 mg by mouth as needed for dizziness.    . ondansetron (ZOFRAN) 8 MG tablet Take 1 tablet (8 mg total) by mouth 3 (three) times daily as needed for nausea or vomiting. 30 tablet 3  . sertraline (ZOLOFT) 50 MG tablet Take 50 mg by mouth daily.     [name] Kitchen temozolomide (TEMODAR) 140 MG capsule Take 140 mg by mouth daily. May take on an empty stomach or at bedtime to decrease nausea & vomiting.    . traZODone (DESYREL) 50 MG tablet Take 50 mg by mouth at bedtime.     [name] Kitchen b complex vitamins tablet Take 1 tablet by mouth daily.    [name] Kitchen oxyCODONE (OXY IR/ROXICODONE) 5 MG immediate release tablet Take 5 mg by mouth every 4 (four) hours as needed.    . sulfamethoxazole-trimethoprim (BACTRIM DS,SEPTRA DS) 800-160 MG per tablet Take 1 tablet by mouth 2 (two) times daily. Mon- Wed-Thu (Patient not taking: Reported on 12/13/2014) 30 tablet 3   No current facility-administered medications for this encounter.    Physical Findings: The patient is in no acute distress. Patient is alert and oriented.  height  is 6\' 2"  (1.88 m) and weight is 223 lb (101.152 kg). His oral temperature is 97.7 F (36.5 C). His blood pressure is 149/68 and his pulse is 56. His respiration is 20 and oxygen saturation is 99%. .     Lab Findings: Lab Results  Component Value Date   WBC 4.7 11/08/2014   HGB 15.3 11/08/2014   HCT 46.8 11/08/2014   MCV 90.6 11/08/2014   PLT 223 11/08/2014     Radiographic Findings: No results found.  Impression/plan:    The patient I believe overall is doing well clinically. He initially had his management at [facility]. He now has been increasing his degree of care here in [place]. The patient may benefit from a brain MRI scan. I will talk to medical oncology to get their opinion on whether this would be reasonable to go ahead and do this within the next  several weeks. Some possible increase in headaches and slight change in speech recently. The patient is eager to undergo repeat imaging. The patient's MRI scan can be reviewed in brain conference after this is completed.      Jodelle Gross, M.D., Ph.D.

## 2014-12-13 NOTE — Addendum Note (Signed)
Encounter addended by: Kyung Rudd, MD on: 12/13/2014 10:35 AM<BR>     Documentation filed: Notes Section

## 2014-12-14 NOTE — Addendum Note (Signed)
Encounter addended by: Kyung Rudd, MD on: 12/14/2014 12:29 AM<BR>     Documentation filed: Dx Association, Flowsheet VN, Orders

## 2014-12-23 ENCOUNTER — Telehealth: Payer: Self-pay | Admitting: *Deleted

## 2014-12-23 NOTE — Telephone Encounter (Signed)
CALLED PATIENT TO INFORM OF MRI FOR 12-30-14 @ 8 AM @ [facility], LVM FOR A RETURN CALL

## 2014-12-27 ENCOUNTER — Other Ambulatory Visit: Payer: Self-pay | Admitting: Radiation Oncology

## 2014-12-27 DIAGNOSIS — C801 Malignant (primary) neoplasm, unspecified: Secondary | ICD-10-CM

## 2014-12-30 ENCOUNTER — Ambulatory Visit (HOSPITAL_COMMUNITY)
Admission: RE | Admit: 2014-12-30 | Discharge: 2014-12-30 | Disposition: A | Discharge status: Home / Self Care | Payer: Medicare Other | Source: Ambulatory Visit | Attending: Radiation Oncology | Admitting: Radiation Oncology

## 2014-12-30 ENCOUNTER — Other Ambulatory Visit: Payer: Self-pay | Admitting: Radiation Oncology

## 2014-12-30 DIAGNOSIS — G936 Cerebral edema: Secondary | ICD-10-CM | POA: Insufficient documentation

## 2014-12-30 DIAGNOSIS — C713 Malignant neoplasm of parietal lobe: Secondary | ICD-10-CM

## 2014-12-30 DIAGNOSIS — R410 Disorientation, unspecified: Secondary | ICD-10-CM | POA: Diagnosis present

## 2014-12-30 DIAGNOSIS — D496 Neoplasm of unspecified behavior of brain: Secondary | ICD-10-CM | POA: Diagnosis not present

## 2014-12-30 LAB — POCT I-STAT CREATININE: Creatinine, Ser: 1.2 mg/dL (ref 0.61–1.24)

## 2014-12-30 MED ORDER — GADOBENATE DIMEGLUMINE 529 MG/ML IV SOLN
20.0000 mL | Freq: Once | INTRAVENOUS | Status: AC | PRN
  Administered 2014-12-30: 20 mL via INTRAVENOUS

## 2015-01-06 ENCOUNTER — Telehealth: Payer: Self-pay

## 2015-01-06 ENCOUNTER — Ambulatory Visit (HOSPITAL_COMMUNITY)
Admission: RE | Admit: 2015-01-06 | Discharge: 2015-01-06 | Disposition: A | Discharge status: Home / Self Care | Payer: Medicare Other | Source: Ambulatory Visit | Attending: Nurse Practitioner | Admitting: Nurse Practitioner

## 2015-01-06 ENCOUNTER — Ambulatory Visit (HOSPITAL_BASED_OUTPATIENT_CLINIC_OR_DEPARTMENT_OTHER): Payer: Medicare Other | Admitting: Nurse Practitioner

## 2015-01-06 ENCOUNTER — Other Ambulatory Visit: Payer: Self-pay | Admitting: *Deleted

## 2015-01-06 ENCOUNTER — Other Ambulatory Visit (HOSPITAL_BASED_OUTPATIENT_CLINIC_OR_DEPARTMENT_OTHER): Payer: Medicare Other

## 2015-01-06 ENCOUNTER — Telehealth: Payer: Self-pay | Admitting: Nurse Practitioner

## 2015-01-06 ENCOUNTER — Telehealth: Payer: Self-pay | Admitting: *Deleted

## 2015-01-06 VITALS — BP 134/75 | HR 69 | Temp 98.3°F | Resp 18 | Ht 74.0 in | Wt 223.9 lb

## 2015-01-06 DIAGNOSIS — R531 Weakness: Secondary | ICD-10-CM | POA: Insufficient documentation

## 2015-01-06 DIAGNOSIS — M79601 Pain in right arm: Secondary | ICD-10-CM | POA: Diagnosis present

## 2015-01-06 DIAGNOSIS — C713 Malignant neoplasm of parietal lobe: Secondary | ICD-10-CM

## 2015-01-06 DIAGNOSIS — M25551 Pain in right hip: Secondary | ICD-10-CM | POA: Diagnosis not present

## 2015-01-06 DIAGNOSIS — W19XXXA Unspecified fall, initial encounter: Secondary | ICD-10-CM

## 2015-01-06 LAB — COMPREHENSIVE METABOLIC PANEL (CC13)
ALBUMIN: 4.2 g/dL (ref 3.5–5.0)
ALK PHOS: 71 U/L (ref 40–150)
ALT: 25 U/L (ref 0–55)
AST: 26 U/L (ref 5–34)
Anion Gap: 9 mEq/L (ref 3–11)
BUN: 22.1 mg/dL (ref 7.0–26.0)
CO2: 24 mEq/L (ref 22–29)
Calcium: 9.5 mg/dL (ref 8.4–10.4)
Chloride: 105 mEq/L (ref 98–109)
Creatinine: 1.2 mg/dL (ref 0.7–1.3)
EGFR: 58 mL/min/{1.73_m2} — ABNORMAL LOW (ref >90)
GLUCOSE: 110 mg/dL (ref 70–140)
POTASSIUM: 4.3 meq/L (ref 3.5–5.1)
Sodium: 138 mEq/L (ref 136–145)
TOTAL PROTEIN: 7.1 g/dL (ref 6.4–8.3)
Total Bilirubin: 1.27 mg/dL — ABNORMAL HIGH (ref 0.20–1.20)

## 2015-01-06 LAB — CBC WITH DIFFERENTIAL/PLATELET
BASO%: 0.4 % (ref 0.0–2.0)
Basophils Absolute: 0 10*3/uL (ref 0.0–0.1)
EOS ABS: 0 10*3/uL (ref 0.0–0.5)
EOS%: 0.5 % (ref 0.0–7.0)
HCT: 47.2 % (ref 38.4–49.9)
HEMOGLOBIN: 16 g/dL (ref 13.0–17.1)
LYMPH%: 14.9 % (ref 14.0–49.0)
MCH: 29.8 pg (ref 27.2–33.4)
MCHC: 33.9 g/dL (ref 32.0–36.0)
MCV: 88 fL (ref 79.3–98.0)
MONO#: 0.7 10*3/uL (ref 0.1–0.9)
MONO%: 10.1 % (ref 0.0–14.0)
NEUT%: 74.1 % (ref 39.0–75.0)
NEUTROS ABS: 5.3 10*3/uL (ref 1.5–6.5)
Platelets: 194 10*3/uL (ref 140–400)
RBC: 5.37 10*6/uL (ref 4.20–5.82)
RDW: 13.3 % (ref 11.0–14.6)
WBC: 7.2 10*3/uL (ref 4.0–10.3)
lymph#: 1.1 10*3/uL (ref 0.9–3.3)

## 2015-01-06 NOTE — Telephone Encounter (Signed)
Called patient to let him know that his right upper arm and right hip/pelvis x-rays were negative for any acute findings.  Advised patient to call/return or go directly to the emergency department for any worsening symptoms whatsoever.  Patient stated understanding of all instructions; was in agreement with this plan of care.

## 2015-01-06 NOTE — Assessment & Plan Note (Signed)
Patient underwent a right craniotomy on 07/29/2014 per [facility].  He completed concurrent Temodar and radiation therapy the end of April 2016.  He initiated maintenance Temodar on 12/11/2014.  The plan is for the patient to increase the Temodar to 200 mg/m on days 1 through 5 of each month starting with his July dosing.  Patient states that he meets with Dr. Lindi Adie this coming Thursday, 01/09/2015 for repeat labs and a follow-up visit to review his most recent brain MRI results.

## 2015-01-06 NOTE — Assessment & Plan Note (Signed)
Bilirubin has increased from 0.80 up to 1.27.  Will continue to monitor closely.

## 2015-01-06 NOTE — Assessment & Plan Note (Signed)
Patient complains of progressive fatigue and weakness.  He states that he becomes increasingly dizzy with any sudden position changes whatsoever.  He took a meclizine tablet earlier this morning for the same complaints; but it did not help.  He denies any new issues with vision changes, nausea, or other neurological symptoms.  Patient states that he was looking in the mirror this morning; and fail when he became dizzy.  He denies hitting his head or any loss of consciousness.  He injured his right upper arm; and now has a large skin tear/avulsion to this area.  He is also complaining of some point tenderness at his right hip area.  He states he continues able to bear weight to his right lower extremity.  On exam.-Patient with an approximately 3-4 cm skin avulsion/tear; and associated tenderness.  Patient continues with full range of motion with his right upper extremity.  He also has some point tenderness to his right hip; but no obvious bruising.  Patient with full range of motion to his right lower extremity; and ambulating with no limp.  Right upper extremity skin avulsion was cleaned with normal saline; and Vaseline gauze placed to wound.  Patient given some wound care supplies as well to take home.  Obtained x-rays of both the right humerus in the right hip/pelvis-which were all negative for acute findings.  Patient states that he takes Advil at home on an as-needed basis only; and prefers not to have a pain medication prescription.

## 2015-01-06 NOTE — Telephone Encounter (Signed)
Office Note dtd 12/25/14 rcvd from [facility].  Reviewed by Dr. Lindi Adie, Sent to scan.

## 2015-01-06 NOTE — Assessment & Plan Note (Signed)
Patient complains of progressive fatigue and weakness.  He states that he becomes increasingly dizzy with any sudden position changes whatsoever.  He took a meclizine tablet earlier this morning for the same complaints; but it did not help.  He denies any new issues with vision changes, nausea, or other neurological symptoms.  Patient states that he was looking in the mirror this morning; and fail when he became dizzy.  He denies hitting his head or any loss of consciousness.  He injured his right upper arm; and now has a large skin tear/avulsion to this area.  He is also complaining of some point tenderness at his right hip area.  He states he continues able to bear weight to his right lower extremity.

## 2015-01-06 NOTE — Telephone Encounter (Signed)
Received message from wife Carder Yin re:  Pt fell at home today, and feeling dizzy.  Called Marcie Bal back and was informed that pt fell at home this am while trying to go to [facility].  Pt c/o pain in Right Hip, had some abrasions on Right Arm - which wife dressed the wounds.  Pt complained of dizziness, and Marcie Bal gave pt Meclizine 25mg  today.  Pt did not want to take pain medication for hip pain.   Marcie Bal also would like to know about pt taking Trazodone at bedtime - could pt have very low dose and/or not taking it at night if pt can sleep fine without med.  Marcie Bal stated pt felt very groggy in the am when he takes Trazodone at night. Instructed Marcie Bal to bring pt in at 1230 pm for lab, and to see Jenny Reichmann, NP symptom management at 1 pm today.  Marcie Bal voiced understanding. Janet's  Phone    [phone].

## 2015-01-06 NOTE — Progress Notes (Signed)
SYMPTOM MANAGEMENT CLINIC   HPI: Ricky Montgomery 69 y.o. male diagnosed with glioblastoma multiform.  Patient is status post craniotomy in concurrent Temodar/radiation therapy.  Currently undergoing maintenance Temodar oral therapy.  Patient complains of progressive fatigue and weakness.  He states that he becomes increasingly dizzy with any sudden position changes whatsoever.  He took a meclizine tablet earlier this morning for the same complaints; but it did not help.  He denies any new issues with vision changes, nausea, or other neurological symptoms.  Patient states that he was looking in the mirror this morning; and fail when he became dizzy.  He denies hitting his head or any loss of consciousness.  He injured his right upper arm; and now has a large skin tear/avulsion to this area.  He is also complaining of some point tenderness at his right hip area.  He states he continues able to bear weight to his right lower extremity.  HPI  ROS  Past Medical History  Diagnosis Date  . Asthma   . GERD (gastroesophageal reflux disease)   . PVC (premature ventricular contraction)   . Thyroid disease     hypothyroidism  . Hypertension   . DVT (deep venous thrombosis)     L leg 30 years ago  . TIA (transient ischemic attack) 4 years ago  . SVT (supraventricular tachycardia)     Hx of RFA  . Refusal of blood transfusions as patient is Jehovah's Witness   . Seizures     YEARS AGO"  . Dyslipidemia   . Normal coronary arteries Oct 2013  . Weight loss, non-intentional 07/31/2013  . Allergy   . 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 Anxiety   . Neuromuscular disorder     left calf, s/p DVT   . 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 S/P radiation therapy 11/08/14    completed,whole brain 60Gy    Past Surgical History  Procedure Laterality Date  . Thyroidectomy, partial  1946  . 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 Shoulder  surgery      FRACTURE  . Cardiac catheterization  04/14/2013    normal coronary arteries and normal left ventricular function.  . Left heart catheterization with coronary angiogram N/A 04/14/2012    Procedure: LEFT HEART CATHETERIZATION WITH CORONARY ANGIOGRAM;  Surgeon: Runell Gess, MD;  Location: [facility] CATH LAB;  Service: Cardiovascular;  Laterality: N/A;  . Supraventricular tachycardia ablation N/A 04/27/2012    Procedure: SUPRAVENTRICULAR TACHYCARDIA ABLATION;  Surgeon: Hillis Range, MD;  Location: [facility] CATH LAB;  Service: Cardiovascular;  Laterality: N/A;    has Unspecified disorder of thyroid; GERD (gastroesophageal reflux disease); HTN (hypertension); Asthma; SVT (supraventricular tachycardia)- hx RFA by Dr Johney Frame Oct 2013; Dyspnea; Enlarged lymph node; Refusal of blood transfusions as patient is Jehovah's Witness; TIA (transient ischemic attack); Dyslipidemia; Normal coronary arteries; Hypertension; Weight loss, non-intentional; Glioblastoma multiforme of parietal lobe; Fall; Weakness; and Hyperbilirubinemia on his problem list.    is allergic to prednisone.    Medication List       This list is accurate as of: 01/06/15  5:03 PM.  Always use your most recent med list.               ALPRAZolam 0.5 MG tablet  Commonly known as:  XANAX  Take 0.5 mg by mouth 2 (two) times daily.     amLODipine 10 MG tablet  Commonly known as:  NORVASC  Take 1 tablet (10 mg total) by mouth daily.     aspirin EC 81 MG tablet  Take 81 mg by mouth daily.     b complex vitamins tablet  Take 1 tablet by mouth daily.     cetirizine 10 MG tablet  Commonly known as:  ZYRTEC  Take 10 mg by mouth daily as needed. For allergies     emollient cream  Commonly known as:  BIAFINE  Apply 1 application topically daily. Apply to affected treated area daily starting  after irritation or when itching occurs     levETIRAcetam 1000 MG tablet  Commonly known as:  KEPPRA  Take 1,000 mg by mouth 2 (two) times  daily.     Magnesium 250 MG Tabs  Take 750 mg by mouth daily.     meclizine 25 MG tablet  Commonly known as:  ANTIVERT  Take 25 mg by mouth as needed for dizziness.     meloxicam 7.5 MG tablet  Commonly known as:  MOBIC  Take 7.5 mg by mouth daily.     ondansetron 8 MG tablet  Commonly known as:  ZOFRAN  Take 1 tablet (8 mg total) by mouth 3 (three) times daily as needed for nausea or vomiting.     oxyCODONE 5 MG immediate release tablet  Commonly known as:  Oxy IR/ROXICODONE  Take 5 mg by mouth every 4 (four) hours as needed.     sertraline 50 MG tablet  Commonly known as:  ZOLOFT  Take 50 mg by mouth daily.     sulfamethoxazole-trimethoprim 800-160 MG per tablet  Commonly known as:  BACTRIM DS,SEPTRA DS  Take 1 tablet by mouth 2 (two) times daily. Mon- Wed-Thu     temozolomide 140 MG capsule  Commonly known as:  TEMODAR  Take 140 mg by mouth daily. May take on an empty stomach or at bedtime to decrease nausea & vomiting.     traZODone 50 MG tablet  Commonly known as:  DESYREL  Take 50 mg by mouth at bedtime.         PHYSICAL EXAMINATION  Oncology Vitals 01/06/2015 12/13/2014 11/18/2014 11/08/2014 11/08/2014 11/01/2014 10/25/2014  Height 188 cm 188 cm 188 cm 188 cm - - -  Weight 101.56 kg 101.152 kg 99.701 kg 98.567 kg 99.111 kg 99.837 kg 99.202 kg  Weight (lbs) 223 lbs 14 oz 223 lbs 219 lbs 13 oz 217 lbs 5 oz 218 lbs 8 oz 220 lbs 2 oz 218 lbs 11 oz  BMI (kg/m2) 28.75 kg/m2 28.63 kg/m2 28.22 kg/m2 27.9 kg/m2 - - -  Temp 98.3 97.7 - 97.5 97.5 97.7 97.3  Pulse 69 56 56 56 57 52 64  Resp 18 20 - 18 20 - 16  SpO2 100 99 - - 97 98 100  BSA (m2) 2.3 m2 2.3 m2 2.28 m2 2.27 m2 - - -   BP Readings from Last 3 Encounters:  01/06/15 134/75  12/13/14 149/68  11/18/14 148/82    Physical Exam  Constitutional: He is oriented to person, place, and time.  Patient appears fatigued, slightly weak, and chronically ill.  HENT:  Head: Normocephalic and atraumatic.  Mouth/Throat:  Oropharynx is clear and moist.  Eyes: Conjunctivae and EOM are normal. Pupils are equal, round, and reactive to light. Right eye exhibits no discharge. Left eye exhibits no discharge. No scleral icterus.  Neck: Normal range of motion. Neck supple.  Pulmonary/Chest: Effort normal. No respiratory distress.  Musculoskeletal:  Normal range of motion. He exhibits tenderness. He exhibits no edema.  C skin, no regarding right upper arm skin avulsion exam.  Patient with mild point tenderness to his right hip area.  There is no obvious injury or trauma to the site.  Patient with full range of motion.  Neurological: He is alert and oriented to person, place, and time.  Skin: Skin is warm and dry. No rash noted. No erythema. There is pallor.  Patient has an approximately 3-4 cm skin avulsion/tear to his right upper outer arm.  Psychiatric: Affect normal.  Nursing note and vitals reviewed.   LABORATORY DATA:. Appointment on 01/06/2015  Component Date Value Ref Range Status  . Sodium 01/06/2015 138  136 - 145 mEq/L Final  . Potassium 01/06/2015 4.3  3.5 - 5.1 mEq/L Final  . Chloride 01/06/2015 105  98 - 109 mEq/L Final  . CO2 01/06/2015 24  22 - 29 mEq/L Final  . Glucose 01/06/2015 110  70 - 140 mg/dl Final  . BUN 01/06/2015 22.1  7.0 - 26.0 mg/dL Final  . Creatinine 01/06/2015 1.2  0.7 - 1.3 mg/dL Final  . Total Bilirubin 01/06/2015 1.27* 0.20 - 1.20 mg/dL Final  . Alkaline Phosphatase 01/06/2015 71  40 - 150 U/L Final  . AST 01/06/2015 26  5 - 34 U/L Final  . ALT 01/06/2015 25  0 - 55 U/L Final  . Total Protein 01/06/2015 7.1  6.4 - 8.3 g/dL Final  . Albumin 01/06/2015 4.2  3.5 - 5.0 g/dL Final  . Calcium 01/06/2015 9.5  8.4 - 10.4 mg/dL Final  . Anion Gap 01/06/2015 9  3 - 11 mEq/L Final  . EGFR 01/06/2015 58* >90 ml/min/1.73 m2 Final   eGFR is calculated using the CKD-EPI Creatinine Equation (2009)  . WBC 01/06/2015 7.2  4.0 - 10.3 10e3/uL Final  . NEUT# 01/06/2015 5.3  1.5 - 6.5 10e3/uL  Final  . HGB 01/06/2015 16.0  13.0 - 17.1 g/dL Final  . HCT 01/06/2015 47.2  38.4 - 49.9 % Final  . Platelets 01/06/2015 194  140 - 400 10e3/uL Final  . MCV 01/06/2015 88.0  79.3 - 98.0 fL Final  . MCH 01/06/2015 29.8  27.2 - 33.4 pg Final  . MCHC 01/06/2015 33.9  32.0 - 36.0 g/dL Final  . RBC 01/06/2015 5.37  4.20 - 5.82 10e6/uL Final  . RDW 01/06/2015 13.3  11.0 - 14.6 % Final  . lymph# 01/06/2015 1.1  0.9 - 3.3 10e3/uL Final  . MONO# 01/06/2015 0.7  0.1 - 0.9 10e3/uL Final  . Eosinophils Absolute 01/06/2015 0.0  0.0 - 0.5 10e3/uL Final  . Basophils Absolute 01/06/2015 0.0  0.0 - 0.1 10e3/uL Final  . NEUT% 01/06/2015 74.1  39.0 - 75.0 % Final  . LYMPH% 01/06/2015 14.9  14.0 - 49.0 % Final  . MONO% 01/06/2015 10.1  0.0 - 14.0 % Final  . EOS% 01/06/2015 0.5  0.0 - 7.0 % Final  . BASO% 01/06/2015 0.4  0.0 - 2.0 % Final     RADIOGRAPHIC STUDIES: Dg Humerus Right  01/06/2015   CLINICAL DATA:  Fall, mid humerus abrasion, posterior humerus pain  EXAM: RIGHT HUMERUS - 2+ VIEW  COMPARISON:  None.  FINDINGS: Four views of the right humerus submitted. No acute fracture or subluxation. Postsurgical changes with prior metallic anchor is noted in right humeral head.  IMPRESSION: No acute fracture or subluxation. Metallic anchor is noted in right humeral head.   Electronically Signed   By: Julien Girt  Pop M.D.   On: 01/06/2015 14:44   Dg Hip Unilat With Pelvis Min 4 Views Right  01/06/2015   CLINICAL DATA:  Fall from standing position, right hip pain  EXAM: RIGHT HIP (WITH PELVIS) 4+ VIEWS  COMPARISON:  None.  FINDINGS: Three views of the right hip submitted. No acute fracture or subluxation. Mild degenerative changes are noted bilateral hip joints with mild narrowing superior hip joint space and mild superior acetabular spurring.  IMPRESSION: No acute fracture or subluxation. Mild degenerative changes bilateral hip joints.   Electronically Signed   By: Lahoma Crocker M.D.   On: 01/06/2015 14:45     ASSESSMENT/PLAN:    Fall Patient complains of progressive fatigue and weakness.  He states that he becomes increasingly dizzy with any sudden position changes whatsoever.  He took a meclizine tablet earlier this morning for the same complaints; but it did not help.  He denies any new issues with vision changes, nausea, or other neurological symptoms.  Patient states that he was looking in the mirror this morning; and fail when he became dizzy.  He denies hitting his head or any loss of consciousness.  He injured his right upper arm; and now has a large skin tear/avulsion to this area.  He is also complaining of some point tenderness at his right hip area.  He states he continues able to bear weight to his right lower extremity.  On exam.-Patient with an approximately 3-4 cm skin avulsion/tear; and associated tenderness.  Patient continues with full range of motion with his right upper extremity.  He also has some point tenderness to his right hip; but no obvious bruising.  Patient with full range of motion to his right lower extremity; and ambulating with no limp.  Right upper extremity skin avulsion was cleaned with normal saline; and Vaseline gauze placed to wound.  Patient given some wound care supplies as well to take home.  Obtained x-rays of both the right humerus in the right hip/pelvis-which were all negative for acute findings.  Patient states that he takes Advil at home on an as-needed basis only; and prefers not to have a pain medication prescription.  Glioblastoma multiforme of parietal lobe Patient underwent a right craniotomy on 07/29/2014 per [facility].  He completed concurrent Temodar and radiation therapy the end of April 2016.  He initiated maintenance Temodar on 12/11/2014.  The plan is for the patient to increase the Temodar to 200 mg/m on days 1 through 5 of each month starting with his July dosing.  Patient states that he meets with Dr. Lindi Adie this coming  Thursday, 01/09/2015 for repeat labs and a follow-up visit to review his most recent brain MRI results.  Hyperbilirubinemia Bilirubin has increased from 0.80 up to 1.27.  Will continue to monitor closely.  Weakness Patient complains of progressive fatigue and weakness.  He states that he becomes increasingly dizzy with any sudden position changes whatsoever.  He took a meclizine tablet earlier this morning for the same complaints; but it did not help.  He denies any new issues with vision changes, nausea, or other neurological symptoms.  Patient states that he was looking in the mirror this morning; and fail when he became dizzy.  He denies hitting his head or any loss of consciousness.  He injured his right upper arm; and now has a large skin tear/avulsion to this area.  He is also complaining of some point tenderness at his right hip area.  He states he continues  able to bear weight to his right lower extremity.    Patient stated understanding of all instructions; and was in agreement with this plan of care. The patient knows to call the clinic with any problems, questions or concerns.   Review/collaboration with Dr. Lindi Adie regarding all aspects of patient's visit today.   Total time spent with patient was 40 minutes;  with greater than 75 percent of that time spent in face to face counseling regarding patient's symptoms,  and coordination of care and follow up.  Disclaimer: This note was dictated with voice recognition software. Similar sounding words can inadvertently be transcribed and may not be corrected upon review.   Drue Second, NP 01/06/2015

## 2015-01-07 ENCOUNTER — Other Ambulatory Visit: Payer: Self-pay | Admitting: Nurse Practitioner

## 2015-01-07 ENCOUNTER — Telehealth: Payer: Self-pay

## 2015-01-07 MED ORDER — DEXAMETHASONE 4 MG PO TABS: 4.0000 mg | ORAL_TABLET | Freq: Four times a day (QID) | ORAL | Status: DC

## 2015-01-07 NOTE — Telephone Encounter (Signed)
Called and spoke with pt wife. Informed her the NP that he saw yesterday 6/27 spoke with Dr. Lindi Adie regarding his scan and Dr. Lindi Adie would like pt to start Dexamethasone. Clarified with Marcie Bal that pt has not tried the dexamethasone but has had a fast heart rate with the prednisone back in January 2016. Pt wife states she is okay with trying the dexamethasone. Preferred pharmacy is [facility] in [place]. Informed Mrs. Mclees that if pt has any problems with the medication before seen by Dr.Gudena Thursday to call. Pt wife verbalized understanding. Pt wife also inquired about having to have labs drawn again Thursday, verified with Terri, Rn with Dr. Lindi Adie pt does not need labs again Thursday 6/30/ POF cent to cancel lab appt.  Mrs. Buday denies any further questions or concerns at this time. Rx sent to pharmacy

## 2015-01-08 ENCOUNTER — Telehealth: Payer: Self-pay | Admitting: *Deleted

## 2015-01-08 MED ORDER — DEXAMETHASONE 4 MG PO TABS: 4.0000 mg | ORAL_TABLET | Freq: Two times a day (BID) | ORAL | Status: AC

## 2015-01-08 NOTE — Telephone Encounter (Signed)
LM for wife on cell phone to return call. Dexamethasone was ordered as QID not BID. Pt is only to take medication BID. Called pharmacy and medication was picked up yesterday. Left detailed message on Home line to take medication BID.

## 2015-01-08 NOTE — Telephone Encounter (Signed)
VM message received @9 :51 am from patient regarding new medication. TC back to pt and spoke with his wife. The question was to clarify how often to take dexamethasone. Wife verbalized that she understood it to be 2 x a day, not 4 . This is correct.  No other needs identified.

## 2015-01-09 ENCOUNTER — Other Ambulatory Visit (HOSPITAL_BASED_OUTPATIENT_CLINIC_OR_DEPARTMENT_OTHER): Payer: Medicare Other

## 2015-01-09 ENCOUNTER — Encounter: Payer: Self-pay | Admitting: Hematology and Oncology

## 2015-01-09 ENCOUNTER — Ambulatory Visit (HOSPITAL_BASED_OUTPATIENT_CLINIC_OR_DEPARTMENT_OTHER): Payer: Medicare Other | Admitting: Hematology and Oncology

## 2015-01-09 VITALS — BP 135/70 | HR 60 | Temp 97.5°F | Resp 19 | Ht 74.0 in | Wt 221.2 lb

## 2015-01-09 DIAGNOSIS — R110 Nausea: Secondary | ICD-10-CM

## 2015-01-09 DIAGNOSIS — C713 Malignant neoplasm of parietal lobe: Secondary | ICD-10-CM

## 2015-01-09 LAB — CBC WITH DIFFERENTIAL/PLATELET
BASO%: 0.1 % (ref 0.0–2.0)
Basophils Absolute: 0 10*3/uL (ref 0.0–0.1)
EOS%: 0 % (ref 0.0–7.0)
Eosinophils Absolute: 0 10*3/uL (ref 0.0–0.5)
HCT: 45 % (ref 38.4–49.9)
HGB: 15.3 g/dL (ref 13.0–17.1)
LYMPH%: 7.7 % — AB (ref 14.0–49.0)
MCH: 29.9 pg (ref 27.2–33.4)
MCHC: 34 g/dL (ref 32.0–36.0)
MCV: 87.7 fL (ref 79.3–98.0)
MONO#: 0.3 10*3/uL (ref 0.1–0.9)
MONO%: 3.7 % (ref 0.0–14.0)
NEUT#: 7.2 10*3/uL — ABNORMAL HIGH (ref 1.5–6.5)
NEUT%: 88.5 % — AB (ref 39.0–75.0)
Platelets: 233 10*3/uL (ref 140–400)
RBC: 5.13 10*6/uL (ref 4.20–5.82)
RDW: 13.2 % (ref 11.0–14.6)
WBC: 8.2 10*3/uL (ref 4.0–10.3)
lymph#: 0.6 10*3/uL — ABNORMAL LOW (ref 0.9–3.3)

## 2015-01-09 NOTE — Assessment & Plan Note (Signed)
Right parietal lobe glioblastoma multiforme: Status post complete resection of a 3.4 cm peripherally enhancing mass within the right parieto-occipital lobe through a craniotomy at [place] on 07/29/2014 with the postoperative brain MRI showing no residual enhancement. Repeat MRI 12/30/2014: infiltrative mass RIGHT occipital lobe with central necrosis and peripheral postcontrast enhancement extending to theRIGHT corpus callosum consistent with residual/ recurrent tumor. The mass is pleomorphic, but estimated measurements based on enhancing component are 47 x 47 x 38 mm. Tumor extends to the subependymal region of the RIGHT lateral ventricle, extends to the medial subarachnoid space at the interhemispheric fissure, and extends to the peripheral superficial margin of the resection site. Extensive vasogenic edema spreads throughout the RIGHT temporal lobe, and RIGHT occipital lobe, as well as into the RIGHT corpus callosum without crossing the midline.  Current treatment: Temodar with radiation (with prophylactic Bactrim) started 09/30/2014 completed 11/08/2014 Temozolomide toxicities:  1. Mild nausea on day 1 but no further problems with nausea  2. Blood counts have been reviewed and they're normal Patient started maintenance temozolomide from June 1 and will do that D1 to day 5 Once a month.   Radiology review: I discussed the result of the recent MRI showing residual/recurrent glioblastoma. There is significant vasogenic edema. We were able to obtain the MRI scan pictures from [facility] from previous scans and on Monday we will present him in the tumor board to discuss these images and compared to the current MRI brain.  Options were discussed with the patient and it appears that it may be unresectable and hence I might recommend Avastin for palliative benefit. We discussed the risks and benefits of Avastin including the risk of bleeding concerns.  Return to clinic based  on tumor board discussion regarding appropriate treatment.

## 2015-01-09 NOTE — Progress Notes (Signed)
Patient Care Team: Suzan Garibaldi, FNP as PCP - General (Nurse Practitioner)  DIAGNOSIS: No matching staging information was found for the patient.  SUMMARY OF ONCOLOGIC HISTORY:   Glioblastoma multiforme of parietal lobe   07/29/2014 Initial Diagnosis Glioblastoma multiforme of parietal lobe: Craniotomy and resection of the tumor at [facility] with no residual enhancement on postop MRI   09/30/2014 - 11/08/2014 Radiation Therapy Temodar with radiation adjuvant therapy   12/30/2014 Imaging Infiltrating mass right occipital lobe with central necrosis extending to the right corpus callosum 4.7 x 4.7 x 3.8 cm, subependymal region of the right lateral ventricle and to medial subarachnoid space and to resection margin, vasogenic edema    CHIEF COMPLIANT: F/U of Brain MRI  INTERVAL HISTORY: Ricky Montgomery is a 69 yr old with H/O GBM who had completed concurrent chemo XRT had a repeat MRI showing changes suggestive of recurrent GBM. He was having dizziness and instability of his feet over the past week and he fell in bathroom and bruised his arm. He was seen by [place] and dressed. He was also started on oral decadron. He hasnt noticed any changes in his dizziness sine starting decadron.  REVIEW OF SYSTEMS:   Constitutional: Denies fevers, chills or abnormal weight loss Eyes: Denies blurriness of vision Ears, nose, mouth, throat, and face: Denies mucositis or sore throat Respiratory: Denies cough, dyspnea or wheezes Cardiovascular: Denies palpitation, chest discomfort or lower extremity swelling Gastrointestinal:  Denies nausea, heartburn or change in bowel habits Skin: Denies abnormal skin rashes Lymphatics: Denies new lymphadenopathy or easy bruising Neurological:Instability and fall risk Behavioral/Psych: Mood is stable, no new changes   All other systems were reviewed with the patient and are negative.  I have reviewed the past medical history, past surgical history, social history and family  history with the patient and they are unchanged from previous note.  ALLERGIES:  is allergic to prednisone.  MEDICATIONS:  Current Outpatient Prescriptions  Medication Sig Dispense Refill  . ALPRAZolam (XANAX) 0.5 MG tablet Take 0.5 mg by mouth 2 (two) times daily.     [name] Kitchen amLODipine (NORVASC) 10 MG tablet Take 1 tablet (10 mg total) by mouth daily. 30 tablet 8  . aspirin EC 81 MG tablet Take 81 mg by mouth daily.    [name] Kitchen b complex vitamins tablet Take 1 tablet by mouth daily.    . cetirizine (ZYRTEC) 10 MG tablet Take 10 mg by mouth daily as needed. For allergies    . dexamethasone (DECADRON) 4 MG tablet Take 1 tablet (4 mg total) by mouth 2 (two) times daily. 112 tablet 0  . emollient (BIAFINE) cream Apply 1 application topically daily. Apply to affected treated area daily starting  after irritation or when itching occurs    . levETIRAcetam (KEPPRA) 1000 MG tablet Take 1,000 mg by mouth 2 (two) times daily.  3  . Magnesium 250 MG TABS Take 750 mg by mouth daily.    . meclizine (ANTIVERT) 25 MG tablet Take 25 mg by mouth as needed for dizziness.    . meloxicam (MOBIC) 7.5 MG tablet Take 7.5 mg by mouth daily.  2  . ondansetron (ZOFRAN) 8 MG tablet Take 1 tablet (8 mg total) by mouth 3 (three) times daily as needed for nausea or vomiting. 30 tablet 3  . oxyCODONE (OXY IR/ROXICODONE) 5 MG immediate release tablet Take 5 mg by mouth every 4 (four) hours as needed.    . sertraline (ZOLOFT) 50 MG tablet Take 50 mg by mouth  daily.     . temozolomide (TEMODAR) 140 MG capsule Take 140 mg by mouth daily. May take on an empty stomach or at bedtime to decrease nausea & vomiting.    . traZODone (DESYREL) 50 MG tablet Take 50 mg by mouth at bedtime.      No current facility-administered medications for this visit.    PHYSICAL EXAMINATION: ECOG PERFORMANCE STATUS: 2 - Symptomatic, <50% confined to bed  Filed Vitals:   01/09/15 1407  BP: 135/70  Pulse: 60  Temp: 97.5 F (36.4 C)  Resp: 19   Filed  Weights   01/09/15 1407  Weight: 221 lb 3 oz (100.33 kg)    GENERAL:alert, no distress and comfortable SKIN: skin color, texture, turgor are normal, no rashes or significant lesions EYES: normal, Conjunctiva are pink and non-injected, sclera clear OROPHARYNX:no exudate, no erythema and lips, buccal mucosa, and tongue normal  NECK: supple, thyroid normal size, non-tender, without nodularity LYMPH:  no palpable lymphadenopathy in the cervical, axillary or inguinal LUNGS: clear to auscultation and percussion with normal breathing effort HEART: regular rate & rhythm and no murmurs and no lower extremity edema ABDOMEN:abdomen soft, non-tender and normal bowel sounds Musculoskeletal:no cyanosis of digits and no clubbing  NEURO: gait instability and dizziness  LABORATORY DATA:  I have reviewed the data as listed   Chemistry      Component Value Date/Time   NA 138 01/06/2015 1220   NA 137 04/19/2012 0924   NA 137 02/07/2012 0734   K 4.3 01/06/2015 1220   K 4.2 04/19/2012 0924   K 4.7 02/07/2012 1409   CL 102 04/19/2012 0924   CL 104 02/07/2012 0734   CO2 24 01/06/2015 1220   CO2 28 04/19/2012 0924   CO2 26 02/07/2012 0734   BUN 22.1 01/06/2015 1220   BUN 20 04/19/2012 0924   BUN 35* 02/07/2012 0734   CREATININE 1.2 01/06/2015 1220   CREATININE 1.20 12/30/2014 0748   CREATININE 1.11 02/07/2012 0734      Component Value Date/Time   CALCIUM 9.5 01/06/2015 1220   CALCIUM 9.5 04/19/2012 0924   CALCIUM 8.7 02/07/2012 0734   ALKPHOS 71 01/06/2015 1220   AST 26 01/06/2015 1220   ALT 25 01/06/2015 1220   BILITOT 1.27* 01/06/2015 1220       Lab Results  Component Value Date   WBC 8.2 01/09/2015   HGB 15.3 01/09/2015   HCT 45.0 01/09/2015   MCV 87.7 01/09/2015   PLT 233 01/09/2015   NEUTROABS 7.2* 01/09/2015   ASSESSMENT & PLAN:  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 Repeat MRI 12/30/2014: infiltrative mass RIGHT occipital lobe with central necrosis and peripheral postcontrast enhancement extending to theRIGHT corpus callosum consistent with residual/ recurrent tumor. The mass is pleomorphic, but estimated measurements based on enhancing component are 47 x 47 x 38 mm. Tumor extends to the subependymal region of the RIGHT lateral ventricle, extends to the medial subarachnoid space at the interhemispheric fissure, and extends to the peripheral superficial margin of the resection site. Extensive vasogenic edema spreads throughout the RIGHT temporal lobe, and RIGHT occipital lobe, as well as into the RIGHT corpus callosum without crossing the midline.  Current treatment: Temodar with radiation (with prophylactic Bactrim) started 09/30/2014 completed 11/08/2014 Temozolomide  toxicities:  1. Mild nausea on day 1 but no further problems with nausea  2. Blood counts have been reviewed and they're normal Patient started maintenance temozolomide from June 1 and will do that D1 to day 5 Once a month.   Radiology review: I discussed the result of the recent MRI showing residual/recurrent glioblastoma. There is significant vasogenic edema. We were able to obtain the MRI scan pictures from [facility] from previous scans and on Monday we will present him in the tumor board to discuss these images and compared to the current MRI brain.  Options were discussed with the patient and it appears that it may be unresectable and hence I might recommend Avastin for palliative benefit. We discussed the risks and benefits of Avastin including the risk of bleeding concerns.  Return to clinic based on tumor board discussion regarding appropriate treatment.   No orders of the defined types were  placed in this encounter.   The patient has a good understanding of the overall plan. he agrees with it. he will call with any problems that may develop before the next visit here.   Rulon Eisenmenger, MD

## 2015-01-16 ENCOUNTER — Telehealth: Payer: Self-pay | Admitting: *Deleted

## 2015-01-16 NOTE — Telephone Encounter (Signed)
"  Can you ask Dr. Lindi Adie if I can split the dexamethasone in half.  He takes 4 mg in the morning and again at 4:00 pm.  He stays up all night.  I believe he is going to reduce the dose."  Return number for spouse Marcie Bal is [phone].

## 2015-01-17 NOTE — Telephone Encounter (Signed)
Spoke with patient's wife Marcie Bal. Advised that it is ok to cut decadron dose in half. Also advised her that Dr. Lindi Adie would be calling after tumor board meets this Monday. She verbalized understanding.

## 2015-01-20 ENCOUNTER — Other Ambulatory Visit: Payer: Self-pay | Admitting: Hematology and Oncology

## 2015-01-20 DIAGNOSIS — C713 Malignant neoplasm of parietal lobe: Secondary | ICD-10-CM

## 2015-01-21 ENCOUNTER — Other Ambulatory Visit: Payer: Self-pay | Admitting: Radiation Oncology

## 2015-01-21 ENCOUNTER — Inpatient Hospital Stay
Admission: RE | Admit: 2015-01-21 | Discharge: 2015-01-21 | Disposition: A | Discharge status: Home / Self Care | Payer: Self-pay | Source: Ambulatory Visit | Attending: Radiation Oncology | Admitting: Radiation Oncology

## 2015-01-21 ENCOUNTER — Telehealth: Payer: Self-pay | Admitting: Hematology and Oncology

## 2015-01-21 DIAGNOSIS — G9389 Other specified disorders of brain: Secondary | ICD-10-CM

## 2015-01-21 NOTE — Telephone Encounter (Signed)
s.w. pt wife and advised on July appt...they will pick up new sched

## 2015-01-22 ENCOUNTER — Other Ambulatory Visit: Payer: Self-pay | Admitting: Radiation Oncology

## 2015-01-22 ENCOUNTER — Inpatient Hospital Stay
Admission: RE | Admit: 2015-01-22 | Discharge: 2015-01-22 | Disposition: A | Discharge status: Home / Self Care | Payer: Self-pay | Source: Ambulatory Visit | Attending: Radiation Oncology | Admitting: Radiation Oncology

## 2015-01-22 ENCOUNTER — Other Ambulatory Visit: Payer: Self-pay | Admitting: Radiology

## 2015-01-22 DIAGNOSIS — G9389 Other specified disorders of brain: Secondary | ICD-10-CM

## 2015-01-23 ENCOUNTER — Encounter (HOSPITAL_COMMUNITY): Payer: Self-pay

## 2015-01-23 ENCOUNTER — Other Ambulatory Visit: Payer: Self-pay | Admitting: Hematology and Oncology

## 2015-01-23 ENCOUNTER — Ambulatory Visit (HOSPITAL_COMMUNITY)
Admission: RE | Admit: 2015-01-23 | Discharge: 2015-01-23 | Disposition: A | Discharge status: Home / Self Care | Payer: Medicare Other | Source: Ambulatory Visit | Attending: Hematology and Oncology | Admitting: Hematology and Oncology

## 2015-01-23 DIAGNOSIS — Z86718 Personal history of other venous thrombosis and embolism: Secondary | ICD-10-CM | POA: Diagnosis not present

## 2015-01-23 DIAGNOSIS — C713 Malignant neoplasm of parietal lobe: Secondary | ICD-10-CM

## 2015-01-23 DIAGNOSIS — Z7982 Long term (current) use of aspirin: Secondary | ICD-10-CM | POA: Diagnosis not present

## 2015-01-23 DIAGNOSIS — K219 Gastro-esophageal reflux disease without esophagitis: Secondary | ICD-10-CM | POA: Insufficient documentation

## 2015-01-23 DIAGNOSIS — Z923 Personal history of irradiation: Secondary | ICD-10-CM | POA: Insufficient documentation

## 2015-01-23 DIAGNOSIS — J45909 Unspecified asthma, uncomplicated: Secondary | ICD-10-CM | POA: Diagnosis not present

## 2015-01-23 DIAGNOSIS — I10 Essential (primary) hypertension: Secondary | ICD-10-CM | POA: Diagnosis not present

## 2015-01-23 DIAGNOSIS — E785 Hyperlipidemia, unspecified: Secondary | ICD-10-CM | POA: Diagnosis not present

## 2015-01-23 DIAGNOSIS — Z87891 Personal history of nicotine dependence: Secondary | ICD-10-CM | POA: Diagnosis not present

## 2015-01-23 DIAGNOSIS — Z8673 Personal history of transient ischemic attack (TIA), and cerebral infarction without residual deficits: Secondary | ICD-10-CM | POA: Diagnosis not present

## 2015-01-23 DIAGNOSIS — C719 Malignant neoplasm of brain, unspecified: Secondary | ICD-10-CM | POA: Diagnosis not present

## 2015-01-23 DIAGNOSIS — E039 Hypothyroidism, unspecified: Secondary | ICD-10-CM | POA: Diagnosis not present

## 2015-01-23 DIAGNOSIS — F419 Anxiety disorder, unspecified: Secondary | ICD-10-CM | POA: Insufficient documentation

## 2015-01-23 LAB — BASIC METABOLIC PANEL
ANION GAP: 7 (ref 5–15)
BUN: 27 mg/dL — ABNORMAL HIGH (ref 6–20)
CHLORIDE: 102 mmol/L (ref 101–111)
CO2: 26 mmol/L (ref 22–32)
Calcium: 9.3 mg/dL (ref 8.9–10.3)
Creatinine, Ser: 0.92 mg/dL (ref 0.61–1.24)
GFR calc non Af Amer: >60 mL/min (ref >60)
Glucose, Bld: 134 mg/dL — ABNORMAL HIGH (ref 65–99)
POTASSIUM: 4.3 mmol/L (ref 3.5–5.1)
SODIUM: 135 mmol/L (ref 135–145)

## 2015-01-23 LAB — CBC
HEMATOCRIT: 46.8 % (ref 39.0–52.0)
HEMOGLOBIN: 15.8 g/dL (ref 13.0–17.0)
MCH: 29.6 pg (ref 26.0–34.0)
MCHC: 33.8 g/dL (ref 30.0–36.0)
MCV: 87.8 fL (ref 78.0–100.0)
Platelets: 178 10*3/uL (ref 150–400)
RBC: 5.33 MIL/uL (ref 4.22–5.81)
RDW: 13 % (ref 11.5–15.5)
WBC: 13.7 10*3/uL — AB (ref 4.0–10.5)

## 2015-01-23 LAB — PROTIME-INR
INR: 0.99 (ref 0.00–1.49)
Prothrombin Time: 13.3 seconds (ref 11.6–15.2)

## 2015-01-23 LAB — APTT: aPTT: 24 seconds (ref 24–37)

## 2015-01-23 MED ORDER — HEPARIN SOD (PORK) LOCK FLUSH 100 UNIT/ML IV SOLN
INTRAVENOUS | Status: AC | PRN
  Administered 2015-01-23: 500 [IU]

## 2015-01-23 MED ORDER — FENTANYL CITRATE (PF) 100 MCG/2ML IJ SOLN
INTRAMUSCULAR | Status: AC | PRN
  Administered 2015-01-23 (×2): 25 ug via INTRAVENOUS

## 2015-01-23 MED ORDER — MIDAZOLAM HCL 2 MG/2ML IJ SOLN
INTRAMUSCULAR | Status: AC
  Filled 2015-01-23: qty 4

## 2015-01-23 MED ORDER — CEFAZOLIN SODIUM-DEXTROSE 2-3 GM-% IV SOLR
INTRAVENOUS | Status: AC
  Filled 2015-01-23: qty 50

## 2015-01-23 MED ORDER — LIDOCAINE HCL 1 % IJ SOLN
INTRAMUSCULAR | Status: AC
  Filled 2015-01-23: qty 20

## 2015-01-23 MED ORDER — HEPARIN SOD (PORK) LOCK FLUSH 100 UNIT/ML IV SOLN
INTRAVENOUS | Status: AC
  Filled 2015-01-23: qty 5

## 2015-01-23 MED ORDER — MIDAZOLAM HCL 2 MG/2ML IJ SOLN
INTRAMUSCULAR | Status: AC | PRN
  Administered 2015-01-23 (×2): 0.5 mg via INTRAVENOUS

## 2015-01-23 MED ORDER — LIDOCAINE-EPINEPHRINE 2 %-1:100000 IJ SOLN
INTRAMUSCULAR | Status: AC
  Filled 2015-01-23: qty 1

## 2015-01-23 MED ORDER — SODIUM CHLORIDE 0.9 % IV SOLN
Freq: Once | INTRAVENOUS | Status: AC
  Administered 2015-01-23: 08:00:00 via INTRAVENOUS

## 2015-01-23 MED ORDER — FENTANYL CITRATE (PF) 100 MCG/2ML IJ SOLN
INTRAMUSCULAR | Status: AC
  Filled 2015-01-23: qty 2

## 2015-01-23 MED ORDER — CEFAZOLIN SODIUM-DEXTROSE 2-3 GM-% IV SOLR
2.0000 g | Freq: Once | INTRAVENOUS | Status: AC
  Administered 2015-01-23: 2 g via INTRAVENOUS

## 2015-01-23 MED ORDER — ATROPINE SULFATE 0.1 MG/ML IJ SOLN
INTRAMUSCULAR | Status: AC
  Filled 2015-01-23: qty 10

## 2015-01-23 NOTE — Procedures (Signed)
Interventional Radiology Procedure Note  Procedure: Placement of a right IJ approach single lumen PowerPort.  Tip is positioned at the superior cavoatrial junction and catheter is ready for immediate use.  Complications: No immediate Recommendations:  - Ok to shower tomorrow - Do not submerge for 7 days - Routine line care   Signed,  Anique Beckley K. [place] Mullett, MD   

## 2015-01-23 NOTE — H&P (Signed)
Chief Complaint: "I'm getting a port a cath"  Referring Physician(s): Ricky Montgomery,Ricky Montgomery  History of Present Illness: Ricky Montgomery is a 69 y.o. male , [employer] witness, with history of glioblastoma multiforme ,s/p resection / concurrent chemo XRT earlier this year . Follow up MRI now reveals changes suggestive of recurrent GBM. He presents today for port a cath placement for palliative Avastin.   Past Medical History  Diagnosis Date  . Asthma   . GERD (gastroesophageal reflux disease)   . PVC (premature ventricular contraction)   . Thyroid disease     hypothyroidism  . Hypertension   . DVT (deep venous thrombosis)     L leg 30 years ago  . TIA (transient ischemic attack) 4 years ago  . SVT (supraventricular tachycardia)     Hx of RFA  . Refusal of blood transfusions as patient is Jehovah's Witness   . Seizures     YEARS AGO"  . Dyslipidemia   . Normal coronary arteries Oct 2013  . Weight loss, non-intentional 07/31/2013  . Allergy   . 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 Anxiety   . Neuromuscular disorder     left calf, s/p DVT   . 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 S/P radiation therapy 11/08/14    completed,whole brain 60Gy    Past Surgical History  Procedure Laterality Date  . Thyroidectomy, partial  1946  . 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 Shoulder surgery      FRACTURE  . Cardiac catheterization  04/14/2013    normal coronary arteries and normal left ventricular function.  . Left heart catheterization with coronary angiogram N/A 04/14/2012    Procedure: LEFT HEART CATHETERIZATION WITH CORONARY ANGIOGRAM;  Surgeon: Ricky Harp, Ricky Montgomery;  Location[facility] CATH LAB;  Service: Cardiovascular;  Laterality: N/A;  . Supraventricular tachycardia ablation N/A 04/27/2012    Procedure: SUPRAVENTRICULAR TACHYCARDIA ABLATION;  Surgeon: Ricky Grayer, Ricky Montgomery;  Location: [facility] CATH LAB;  Service: Cardiovascular;  Laterality:  N/A;    Allergies: Prednisone  Medications: Prior to Admission medications   Medication Sig Start Date End Date Taking? Authorizing Provider  ALPRAZolam Duanne Moron) 0.5 MG tablet Take 0.5 mg by mouth 3 (three) times daily as needed for anxiety.  04/05/13   Historical Provider, Ricky Montgomery  ALPRAZolam Duanne Moron) 1 MG tablet Take 1 mg by mouth 3 (three) times daily as needed. For anxiety. Not yet started still using the 0.5mg . 01/17/15   Historical Provider, Ricky Montgomery  amLODipine (NORVASC) 10 MG tablet Take 1 tablet (10 mg total) by mouth daily. 09/02/14   Ricky Harp, Ricky Montgomery  aspirin EC 81 MG tablet Take 81 mg by mouth daily.    Historical Provider, Ricky Montgomery  cetirizine (ZYRTEC) 10 MG tablet Take 10 mg by mouth daily as needed. For allergies    Historical Provider, Ricky Montgomery  dexamethasone (DECADRON) 4 MG tablet Take 1 tablet (4 mg total) by mouth 2 (two) times daily. Patient taking differently: Take 2 mg by mouth 2 (two) times daily.  01/08/15   Ricky Borders, Ricky Montgomery  emollient (BIAFINE) cream Apply 1 application topically daily as needed (itching). Apply to affected treated area daily starting  after irritation or when itching occurs 10/04/14   Ricky Rudd, Ricky Montgomery  levETIRAcetam (KEPPRA) 1000 MG tablet Take 1,000 mg by mouth 2 (two) times daily. 08/31/14   Historical Provider, Ricky Montgomery  Magnesium 250 MG TABS Take 750  mg by mouth daily.    Historical Provider, Ricky Montgomery  meclizine (ANTIVERT) 25 MG tablet Take 25 mg by mouth as needed for dizziness.    Historical Provider, Ricky Montgomery  ondansetron (ZOFRAN) 8 MG tablet Take 1 tablet (8 mg total) by mouth 3 (three) times daily as needed for nausea or vomiting. 09/30/14   Ricky Lose, Ricky Montgomery  oxyCODONE (OXY IR/ROXICODONE) 5 MG immediate release tablet Take 5 mg by mouth every 4 (four) hours as needed. 08/09/14   Historical Provider, Ricky Montgomery  sertraline (ZOLOFT) 100 MG tablet Take 100 mg by mouth daily. Not yet started, still taking 50mg . 01/17/15   Historical Provider, Ricky Montgomery  sertraline (ZOLOFT) 50 MG tablet Take 50 mg by mouth  daily.  04/04/13   Historical Provider, Ricky Montgomery  traZODone (DESYREL) 50 MG tablet Take 50 mg by mouth at bedtime.  04/17/13   Historical Provider, Ricky Montgomery     Family History  Problem Relation Age of Onset  . Alcohol abuse Other     Grandparent  . Leukemia Brother 28    History   Social History  . Marital Status: Married    Spouse Name: Ricky Montgomery  . Number of Children: 3  . Years of Education: 11   Occupational History  . Retired     Games developer   Social History Main Topics  . Smoking status: Former Smoker -- 0.50 packs/day for 5 years    Types: Cigarettes    Quit date: 07/12/1964  . Smokeless tobacco: Never Used     Comment: smoked form age [age], none since  . Alcohol Use: No  . Drug Use: No  . Sexual Activity: Not Currently   Other Topics Concern  . None   Social History Narrative   Pt lives in [place] with spouse.   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      Has many grandkids       Review of Systems  Constitutional: Negative for fever and chills.  Respiratory: Negative for cough and shortness of breath.   Cardiovascular: Negative for chest pain.  Gastrointestinal: Negative for nausea, vomiting, abdominal pain and blood in stool.  Genitourinary: Negative for dysuria.  Musculoskeletal: Negative for back pain.  Neurological: Positive for dizziness and weakness. Negative for headaches.       Gait instability  Psychiatric/Behavioral: The patient is nervous/anxious.     Vital Signs: Ht 6\' 2"  (1.88 m)  Wt 223 lb (101.152 kg)  BMI 28.62 kg/m2  Physical Exam  Constitutional: He is oriented to person, place, and time. He appears well-developed and well-nourished.  Cardiovascular: Regular rhythm.   Bradycardic but regular  Pulmonary/Chest: Effort normal and breath sounds normal.  Abdominal: Soft. Bowel sounds are normal. There is no tenderness.  Neurological: He is alert and oriented to  person, place, and time.  Loss of peripheral vision on left; facial asymm    Mallampati Score:     Imaging: [name] Contrast  12/30/2014   CLINICAL DATA:  History of GBM. Patient complains of confusion and difficulty walking.  EXAM: MRI HEAD WITHOUT AND WITH CONTRAST  TECHNIQUE: Multiplanar, multiecho pulse sequences of the brain and surrounding structures were obtained without and with intravenous contrast.  CONTRAST:  79mL MULTIHANCE GADOBENATE DIMEGLUMINE 529 MG/ML IV SOLN  COMPARISON:  Preoperative CT head 07/23/2014. No pre or postoperative MR imaging is available.  FINDINGS: There is a RIGHT occipital craniotomy. Surgical  encephalomalacia with blood products is seen reflecting previous biopsy and subtotal resection.  There is an infiltrative mass RIGHT occipital lobe with central necrosis and peripheral postcontrast enhancement extending to the RIGHT corpus callosum consistent with residual/ recurrent tumor. The mass is pleomorphic, but estimated measurements based on enhancing component are 47 x 47 x 38 mm. Tumor extends to the subependymal region of the RIGHT lateral ventricle, extends to the medial subarachnoid space at the interhemispheric fissure, and extends to the peripheral superficial margin of the resection site. Extensive vasogenic edema spreads throughout the RIGHT temporal lobe, and RIGHT occipital lobe, as well as into the RIGHT corpus callosum without crossing the midline. This likely represents nonenhancing tumor.  Generalized atrophy with moderately advanced chronic microvascular ischemic change. No strongly restricted diffusion to suggest infarction, but mildly restricted diffusion at the periphery of the tumor likely represents hypercellular neoplasm. No midline shift. Flow voids are maintained. Extracranial soft tissues unremarkable.  IMPRESSION: Residual/recurrent tumor RIGHT occipital lobe, with the enhancing component measuring 47 x 47 x 38 mm. See discussion above.   Moderate vasogenic edema spreads throughout the RIGHT temporal lobe and RIGHT occipital lobe extending to the corpus callosum.   Electronically Signed   By: Staci Righter M.D.   On: 12/30/2014 08:47   Dg Humerus Right  01/06/2015   CLINICAL DATA:  Fall, mid humerus abrasion, posterior humerus pain  EXAM: RIGHT HUMERUS - 2+ VIEW  COMPARISON:  None.  FINDINGS: Four views of the right humerus submitted. No acute fracture or subluxation. Postsurgical changes with prior metallic anchor is noted in right humeral head.  IMPRESSION: No acute fracture or subluxation. Metallic anchor is noted in right humeral head.   Electronically Signed   By: Lahoma Crocker M.D.   On: 01/06/2015 14:44   Dg Hip Unilat With Pelvis Min 4 Views Right  01/06/2015   CLINICAL DATA:  Fall from standing position, right hip pain  EXAM: RIGHT HIP (WITH PELVIS) 4+ VIEWS  COMPARISON:  None.  FINDINGS: Three views of the right hip submitted. No acute fracture or subluxation. Mild degenerative changes are noted bilateral hip joints with mild narrowing superior hip joint space and mild superior acetabular spurring.  IMPRESSION: No acute fracture or subluxation. Mild degenerative changes bilateral hip joints.   Electronically Signed   By: Lahoma Crocker M.D.   On: 01/06/2015 14:45    Labs:  CBC:  Recent Labs  11/08/14 0757 01/06/15 1220 01/09/15 1352 01/23/15 0810  WBC 4.7 7.2 8.2 13.7*  HGB 15.3 16.0 15.3 15.8  HCT 46.8 47.2 45.0 46.8  PLT 223 194 233 178    COAGS:  Recent Labs  01/23/15 0810  INR 0.99  APTT 24    BMP:  Recent Labs  10/25/14 0812 12/30/14 0748 01/06/15 1220  NA 138  --  138  K 4.3  --  4.3  CO2 23  --  24  GLUCOSE 103  --  110  BUN 16.9  --  22.1  CALCIUM 8.2*  --  9.5  CREATININE 1.1 1.20 1.2    LIVER FUNCTION TESTS:  Recent Labs  10/25/14 0812 01/06/15 1220  BILITOT 0.80 1.27*  AST 27 26  ALT 26 25  ALKPHOS 68 71  PROT 6.9 7.1  ALBUMIN 4.2 4.2    TUMOR MARKERS: No results for  input(s): AFPTM, CEA, CA199, CHROMGRNA in the last 8760 hours.  Assessment and Plan: Ricky Montgomery is a 69 y.o. male , [place] witness, with history of glioblastoma  multiforme ,s/p resection / concurrent chemo XRT earlier this year . Follow up MRI now reveals changes suggestive of recurrent GBM. He presents today for port a cath placement for palliative Avastin.Risks and benefits discussed with the patient/family including, but not limited to bleeding, infection, pneumothorax, or fibrin sheath development and need for additional procedures.All of the patient's questions were answered, patient is agreeable to proceed.Consent signed and in chart.       Signed: D. Rowe Robert 01/23/2015, 8:51 AM   I spent a total of 30 minutes in face to face in clinical consultation, greater than 50% of which was counseling/coordinating care for port a cath placement

## 2015-01-23 NOTE — Discharge Instructions (Signed)
Implanted Port Insertion, Care After °Refer to this sheet in the next few weeks. These instructions provide you with information on caring for yourself after your procedure. Your health care provider may also give you more specific instructions. Your treatment has been planned according to current medical practices, but problems sometimes occur. Call your health care provider if you have any problems or questions after your procedure. °WHAT TO EXPECT AFTER THE PROCEDURE °After your procedure, it is typical to have the following:  °· Discomfort at the port insertion site. Ice packs to the area will help. °· Bruising on the skin over the port. This will subside in 3-4 days. °HOME CARE INSTRUCTIONS °· After your port is placed, you will get a manufacturer's information card. The card has information about your port. Keep this card with you at all times.   °· Know what kind of port you have. There are many types of ports available.   °· Wear a medical alert bracelet in case of an emergency. This can help alert health care workers that you have a port.   °· The port can stay in for as long as your health care provider believes it is necessary.   °· A home health care nurse may give medicines and take care of the port.   °· You or a family member can get special training and directions for giving medicine and taking care of the port at home.   °SEEK MEDICAL CARE IF:  °· Your port does not flush or you are unable to get a blood return.   °· You have a fever or chills. °SEEK IMMEDIATE MEDICAL CARE IF: °· You have new fluid or pus coming from your incision.   °· You notice a bad smell coming from your incision site.   °· You have swelling, pain, or more redness at the incision or port site.   °· You have chest pain or shortness of breath. °Document Released: 04/18/2013 Document Revised: 07/03/2013 Document Reviewed: 04/18/2013 °Exit[employer]® Patient Information ©2015 [employer]. This information is not intended to replace  advice given to you by your health care provider. Make sure you discuss any questions you have with your health care provider. °Implanted Port Home Guide °An implanted port is a type of central line that is placed under the skin. Central lines are used to provide IV access when treatment or nutrition needs to be given through a person's veins. Implanted ports are used for long-term IV access. An implanted port may be placed because:  °· You need IV medicine that would be irritating to the small veins in your hands or arms.   °· You need long-term IV medicines, such as antibiotics.   °· You need IV nutrition for a long period.   °· You need frequent blood draws for lab tests.   °· You need dialysis.   °Implanted ports are usually placed in the chest area, but they can also be placed in the upper arm, the abdomen, or the leg. An implanted port has two main parts:  °· Reservoir. The reservoir is round and will appear as a small, raised area under your skin. The reservoir is the part where a needle is inserted to give medicines or draw blood.   °· Catheter. The catheter is a thin, flexible tube that extends from the reservoir. The catheter is placed into a large vein. Medicine that is inserted into the reservoir goes into the catheter and then into the vein.   °HOW WILL I CARE FOR MY INCISION SITE? °Do not get the incision site wet. Bathe or   shower as directed by your health care provider.  °HOW IS MY PORT ACCESSED? °Special steps must be taken to access the port:  °· Before the port is accessed, a numbing cream can be placed on the skin. This helps numb the skin over the port site.   °· Your health care provider uses a sterile technique to access the port. °· Your health care provider must put on a mask and sterile gloves. °· The skin over your port is cleaned carefully with an antiseptic and allowed to dry. °· The port is gently pinched between sterile gloves, and a needle is inserted into the port. °· Only  "non-coring" port needles should be used to access the port. Once the port is accessed, a blood return should be checked. This helps ensure that the port is in the vein and is not clogged.   °· If your port needs to remain accessed for a constant infusion, a clear (transparent) bandage will be placed over the needle site. The bandage and needle will need to be changed every week, or as directed by your health care provider.   °· Keep the bandage covering the needle clean and dry. Do not get it wet. Follow your health care provider's instructions on how to take a shower or bath while the port is accessed.   °· If your port does not need to stay accessed, no bandage is needed over the port.   °WHAT IS FLUSHING? °Flushing helps keep the port from getting clogged. Follow your health care provider's instructions on how and when to flush the port. Ports are usually flushed with saline solution or a medicine called heparin. The need for flushing will depend on how the port is used.  °· If the port is used for intermittent medicines or blood draws, the port will need to be flushed:   °· After medicines have been given.   °· After blood has been drawn.   °· As part of routine maintenance.   °· If a constant infusion is running, the port may not need to be flushed.   °HOW LONG WILL MY PORT STAY IMPLANTED? °The port can stay in for as long as your health care provider thinks it is needed. When it is time for the port to come out, surgery will be done to remove it. The procedure is similar to the one performed when the port was put in.  °WHEN SHOULD I SEEK IMMEDIATE MEDICAL CARE? °When you have an implanted port, you should seek immediate medical care if:  °· You notice a bad smell coming from the incision site.   °· You have swelling, redness, or drainage at the incision site.   °· You have more swelling or pain at the port site or the surrounding area.   °· You have a fever that is not controlled with medicine. °Document  Released: 06/28/2005 Document Revised: 04/18/2013 Document Reviewed: 03/05/2013 °ExitCare® Patient Information ©2015 [employer]. This information is not intended to replace advice given to you by your health care provider. Make sure you discuss any questions you have with your health care provider. °Conscious Sedation °Sedation is the use of medicines to promote relaxation and relieve discomfort and anxiety. Conscious sedation is a type of sedation. Under conscious sedation you are less alert than normal but are still able to respond to instructions or stimulation. Conscious sedation is used during short medical and dental procedures. It is milder than deep sedation or general anesthesia and allows you to return to your regular activities sooner.  °LET YOUR HEALTH CARE PROVIDER   KNOW ABOUT:  °· Any allergies you have. °· All medicines you are taking, including vitamins, herbs, eye drops, creams, and over-the-counter medicines. °· Use of steroids (by mouth or creams). °· Previous problems you or members of your family have had with the use of anesthetics. °· Any blood disorders you have. °· Previous surgeries you have had. °· Medical conditions you have. °· Possibility of pregnancy, if this applies. °· Use of cigarettes, alcohol, or illegal drugs. °RISKS AND COMPLICATIONS °Generally, this is a safe procedure. However, as with any procedure, problems can occur. Possible problems include: °· Oversedation. °· Trouble breathing on your own. You may need to have a breathing tube until you are awake and breathing on your own. °· Allergic reaction to any of the medicines used for the procedure. °BEFORE THE PROCEDURE °· You may have blood tests done. These tests can help show how well your kidneys and liver are working. They can also show how well your blood clots. °· A physical exam will be done.   °· Only take medicines as directed by your health care provider. You may need to stop taking medicines (such as blood  thinners, aspirin, or nonsteroidal anti-inflammatory drugs) before the procedure.   °· Do not eat or drink at least 6 hours before the procedure or as directed by your health care provider. °· Arrange for a responsible adult, family member, or friend to take you home after the procedure. He or she should stay with you for at least 24 hours after the procedure, until the medicine has worn off. °PROCEDURE  °· An intravenous (IV) catheter will be inserted into one of your veins. Medicine will be able to flow directly into your body through this catheter. You may be given medicine through this tube to help prevent pain and help you relax. °· The medical or dental procedure will be done. °AFTER THE PROCEDURE °· You will stay in a recovery area until the medicine has worn off. Your blood pressure and pulse will be checked.   °·  Depending on the procedure you had, you may be allowed to go home when you can tolerate liquids and your pain is under control. °Document Released: 03/23/2001 Document Revised: 07/03/2013 Document Reviewed: 03/05/2013 °ExitCare® Patient Information ©2015 [employer]. This information is not intended to replace advice given to you by your health care provider. Make sure you discuss any questions you have with your health care provider. °Conscious Sedation, Adult, Care After °Refer to this sheet in the next few weeks. These instructions provide you with information on caring for yourself after your procedure. Your health care provider may also give you more specific instructions. Your treatment has been planned according to current medical practices, but problems sometimes occur. Call your health care provider if you have any problems or questions after your procedure. °WHAT TO EXPECT AFTER THE PROCEDURE  °After your procedure: °· You may feel sleepy, clumsy, and have poor balance for several hours. °· Vomiting may occur if you eat too soon after the procedure. °HOME CARE INSTRUCTIONS °· Do not  participate in any activities where you could become injured for at least 24 hours. Do not: °¨ Drive. °¨ Swim. °¨ Ride a bicycle. °¨ Operate heavy machinery. °¨ Cook. °¨ Use power tools. °¨ Climb ladders. °¨ Work from a high place. °· Do not make important decisions or sign legal documents until you are improved. °· If you vomit, drink water, juice, or soup when you can drink without vomiting. Make sure you have little   or no nausea before eating solid foods.  Only take over-the-counter or prescription medicines for pain, discomfort, or fever as directed by your health care provider.  Make sure you and your family fully understand everything about the medicines given to you, including what side effects may occur.  You should not drink alcohol, take sleeping pills, or take medicines that cause drowsiness for at least 24 hours.  If you smoke, do not smoke without supervision.  If you are feeling better, you may resume normal activities 24 hours after you were sedated.  Keep all appointments with your health care provider. SEEK MEDICAL CARE IF:  Your skin is pale or bluish in color.  You continue to feel nauseous or vomit.  Your pain is getting worse and is not helped by medicine.  You have bleeding or swelling.  You are still sleepy or feeling clumsy after 24 hours. SEEK IMMEDIATE MEDICAL CARE IF:  You develop a rash.  You have difficulty breathing.  You develop any type of allergic problem.  You have a fever. MAKE SURE YOU:  Understand these instructions.  Will watch your condition.  Will get help right away if you are not doing well or get worse. Document Released: 04/18/2013 Document Reviewed: 04/18/2013 [facility] Patient Information 2015 [place], [place]. This information is not intended to replace advice given to you by your health care provider. Make sure you discuss any questions you have with your health care provider. Conscious Sedation, Adult, Care After Refer to this  sheet in the next few weeks. These instructions provide you with information on caring for yourself after your procedure. Your health care provider may also give you more specific instructions. Your treatment has been planned according to current medical practices, but problems sometimes occur. Call your health care provider if you have any problems or questions after your procedure. WHAT TO EXPECT AFTER THE PROCEDURE  After your procedure:  You may feel sleepy, clumsy, and have poor balance for several hours.  Vomiting may occur if you eat too soon after the procedure. HOME CARE INSTRUCTIONS  Do not participate in any activities where you could become injured for at least 24 hours. Do not:  Drive.  Swim.  Ride a bicycle.  Operate heavy machinery.  Cook.  Use power tools.  Climb ladders.  Work from a high place.  Do not make important decisions or sign legal documents until you are improved.  If you vomit, drink water, juice, or soup when you can drink without vomiting. Make sure you have little or no nausea before eating solid foods.  Only take over-the-counter or prescription medicines for pain, discomfort, or fever as directed by your health care provider.  Make sure you and your family fully understand everything about the medicines given to you, including what side effects may occur.  You should not drink alcohol, take sleeping pills, or take medicines that cause drowsiness for at least 24 hours.  If you smoke, do not smoke without supervision.  If you are feeling better, you may resume normal activities 24 hours after you were sedated.  Keep all appointments with your health care provider. SEEK MEDICAL CARE IF:  Your skin is pale or bluish in color.  You continue to feel nauseous or vomit.  Your pain is getting worse and is not helped by medicine.  You have bleeding or swelling.  You are still sleepy or feeling clumsy after 24 hours. SEEK IMMEDIATE MEDICAL  CARE IF:  You develop a rash.  You have difficulty breathing.  You develop any type of allergic problem.  You have a fever. MAKE SURE YOU:  Understand these instructions.  Will watch your condition.  Will get help right away if you are not doing well or get worse. Document Released: 04/18/2013 Document Reviewed: 04/18/2013 [facility] Patient Information 2015 [place], [place]. This information is not intended to replace advice given to you by your health care provider. Make sure you discuss any questions you have with your health care provider.

## 2015-01-30 ENCOUNTER — Ambulatory Visit (HOSPITAL_BASED_OUTPATIENT_CLINIC_OR_DEPARTMENT_OTHER): Payer: Medicare Other | Admitting: Hematology and Oncology

## 2015-01-30 ENCOUNTER — Encounter: Payer: Self-pay | Admitting: Hematology and Oncology

## 2015-01-30 ENCOUNTER — Other Ambulatory Visit (HOSPITAL_BASED_OUTPATIENT_CLINIC_OR_DEPARTMENT_OTHER): Payer: Medicare Other

## 2015-01-30 ENCOUNTER — Telehealth: Payer: Self-pay | Admitting: Hematology and Oncology

## 2015-01-30 ENCOUNTER — Ambulatory Visit (HOSPITAL_BASED_OUTPATIENT_CLINIC_OR_DEPARTMENT_OTHER): Payer: Medicare Other

## 2015-01-30 VITALS — BP 130/68 | HR 52

## 2015-01-30 VITALS — BP 132/69 | HR 58 | Temp 98.2°F | Resp 18 | Ht 74.0 in | Wt 219.3 lb

## 2015-01-30 DIAGNOSIS — C713 Malignant neoplasm of parietal lobe: Secondary | ICD-10-CM

## 2015-01-30 DIAGNOSIS — R809 Proteinuria, unspecified: Secondary | ICD-10-CM

## 2015-01-30 DIAGNOSIS — Z5112 Encounter for antineoplastic immunotherapy: Secondary | ICD-10-CM | POA: Diagnosis not present

## 2015-01-30 LAB — CBC WITH DIFFERENTIAL/PLATELET
BASO%: 0.5 % (ref 0.0–2.0)
Basophils Absolute: 0.1 10*3/uL (ref 0.0–0.1)
EOS ABS: 0 10*3/uL (ref 0.0–0.5)
EOS%: 0 % (ref 0.0–7.0)
HCT: 47.6 % (ref 38.4–49.9)
HGB: 16.1 g/dL (ref 13.0–17.1)
LYMPH#: 0.5 10*3/uL — AB (ref 0.9–3.3)
LYMPH%: 4.9 % — AB (ref 14.0–49.0)
MCH: 30 pg (ref 27.2–33.4)
MCHC: 33.9 g/dL (ref 32.0–36.0)
MCV: 88.4 fL (ref 79.3–98.0)
MONO#: 0.8 10*3/uL (ref 0.1–0.9)
MONO%: 7.7 % (ref 0.0–14.0)
NEUT%: 86.9 % — AB (ref 39.0–75.0)
NEUTROS ABS: 8.8 10*3/uL — AB (ref 1.5–6.5)
Platelets: 161 10*3/uL (ref 140–400)
RBC: 5.38 10*6/uL (ref 4.20–5.82)
RDW: 13.8 % (ref 11.0–14.6)
WBC: 10.1 10*3/uL (ref 4.0–10.3)

## 2015-01-30 LAB — UA PROTEIN, DIPSTICK - CHCC: Protein, ur: NEGATIVE mg/dL

## 2015-01-30 MED ORDER — HEPARIN SOD (PORK) LOCK FLUSH 100 UNIT/ML IV SOLN
500.0000 [IU] | Freq: Once | INTRAVENOUS | Status: AC | PRN
  Administered 2015-01-30: 500 [IU]
  Filled 2015-01-30: qty 5

## 2015-01-30 MED ORDER — LIDOCAINE-PRILOCAINE 2.5-2.5 % EX CREA: 1.0000 "application " | TOPICAL_CREAM | CUTANEOUS | Status: AC | PRN

## 2015-01-30 MED ORDER — SODIUM CHLORIDE 0.9 % IJ SOLN
10.0000 mL | INTRAMUSCULAR | Status: DC | PRN
  Administered 2015-01-30: 10 mL
  Filled 2015-01-30: qty 10

## 2015-01-30 MED ORDER — SODIUM CHLORIDE 0.9 % IV SOLN
Freq: Once | INTRAVENOUS | Status: AC
  Administered 2015-01-30: 13:00:00 via INTRAVENOUS

## 2015-01-30 MED ORDER — BEVACIZUMAB CHEMO INJECTION 400 MG/16ML
10.0000 mg/kg | Freq: Once | INTRAVENOUS | Status: AC
  Administered 2015-01-30: 1000 mg via INTRAVENOUS
  Filled 2015-01-30: qty 40

## 2015-01-30 NOTE — Progress Notes (Signed)
Patient Care Team: Suzan Garibaldi, FNP as PCP - General (Nurse Practitioner)  DIAGNOSIS: No matching staging information was found for the patient.  SUMMARY OF ONCOLOGIC HISTORY:   Glioblastoma multiforme of parietal lobe   07/29/2014 Initial Diagnosis Glioblastoma multiforme of parietal lobe: Craniotomy and resection of the tumor at [facility] with no residual enhancement on postop MRI   09/30/2014 - 11/08/2014 Radiation Therapy Temodar with radiation adjuvant therapy   12/30/2014 Imaging Infiltrating mass right occipital lobe with central necrosis extending to the right corpus callosum 4.7 x 4.7 x 3.8 cm, subependymal region of the right lateral ventricle and to medial subarachnoid space and to resection margin, vasogenic edema   01/30/2015 -  Chemotherapy Avastin palliative treatment every 3 weeks    CHIEF COMPLIANT: Cycle 1 of Avastin  INTERVAL HISTORY: Ricky Montgomery is a 69 year old with above-mentioned history of recurrent glioblastoma multiforme, who is here today to start first cycle of Avastin. He reports that his vision has completely gone his balance and coordination issues have markedly diminished area is becoming severely anxious multiple times with a day. Ativan helps his anxiety. However he does not want to be somnolent all the time. He does not have energy to do much activity outside the house.  REVIEW OF SYSTEMS:   Constitutional: Denies fevers, chills or abnormal weight loss Eyes: Denies blurriness of vision Ears, nose, mouth, throat, and face: Denies mucositis or sore throat Respiratory: Denies cough, dyspnea or wheezes Cardiovascular: Denies palpitation, chest discomfort or lower extremity swelling Gastrointestinal:  Denies nausea, heartburn or change in bowel habits Skin: Denies abnormal skin rashes Lymphatics: Denies new lymphadenopathy or easy bruising Neuro: Vision loss, weakness, incoordination, loss of balance Behavioral/Psych Severe anxiety  All other systems  were reviewed with the patient and are negative.  I have reviewed the past medical history, past surgical history, social history and family history with the patient and they are unchanged from previous note.  ALLERGIES:  is allergic to prednisone.  MEDICATIONS:  Current Outpatient Prescriptions  Medication Sig Dispense Refill  . ALPRAZolam (XANAX) 0.5 MG tablet Take 0.5 mg by mouth 3 (three) times daily as needed for anxiety.     . ALPRAZolam (XANAX) 1 MG tablet Take 1 mg by mouth 3 (three) times daily as needed. For anxiety. Not yet started still using the 0.5mg .  2  . amLODipine (NORVASC) 10 MG tablet Take 1 tablet (10 mg total) by mouth daily. 30 tablet 8  . aspirin EC 81 MG tablet Take 81 mg by mouth daily.    . cetirizine (ZYRTEC) 10 MG tablet Take 10 mg by mouth daily as needed. For allergies    . dexamethasone (DECADRON) 4 MG tablet Take 1 tablet (4 mg total) by mouth 2 (two) times daily. (Patient taking differently: Take 2 mg by mouth 2 (two) times daily. ) 112 tablet 0  . emollient (BIAFINE) cream Apply 1 application topically daily as needed (itching). Apply to affected treated area daily starting  after irritation or when itching occurs    . levETIRAcetam (KEPPRA) 1000 MG tablet Take 1,000 mg by mouth 2 (two) times daily.  3  . Magnesium 250 MG TABS Take 750 mg by mouth daily.    . meclizine (ANTIVERT) 25 MG tablet Take 25 mg by mouth as needed for dizziness.    . ondansetron (ZOFRAN) 8 MG tablet Take 1 tablet (8 mg total) by mouth 3 (three) times daily as needed for nausea or vomiting. 30 tablet 3  .  oxyCODONE (OXY IR/ROXICODONE) 5 MG immediate release tablet Take 5 mg by mouth every 4 (four) hours as needed.    . sertraline (ZOLOFT) 100 MG tablet Take 100 mg by mouth daily. Not yet started, still taking 50mg .  2  . sertraline (ZOLOFT) 50 MG tablet Take 50 mg by mouth daily.     . traZODone (DESYREL) 50 MG tablet Take 50 mg by mouth at bedtime.     . lidocaine-prilocaine (EMLA)  cream Apply 1 application topically as needed. 30 g 6   No current facility-administered medications for this visit.    PHYSICAL EXAMINATION: ECOG PERFORMANCE STATUS: 2 - Symptomatic, <50% confined to bed  Filed Vitals:   01/30/15 1128  BP: 132/69  Pulse: 58  Temp: 98.2 F (36.8 C)  Resp: 18   Filed Weights   01/30/15 1128  Weight: 219 lb 5 oz (99.479 kg)    GENERAL:alert, no distress and comfortable SKIN: skin color, texture, turgor are normal, no rashes or significant lesions EYES: normal, Conjunctiva are pink and non-injected, sclera clear OROPHARYNX:no exudate, no erythema and lips, buccal mucosa, and tongue normal  NECK: supple, thyroid normal size, non-tender, without nodularity LYMPH:  no palpable lymphadenopathy in the cervical, axillary or inguinal LUNGS: clear to auscultation and percussion with normal breathing effort HEART: regular rate & rhythm and no murmurs and no lower extremity edema ABDOMEN:abdomen soft, non-tender and normal bowel sounds Musculoskeletal:no cyanosis of digits and no clubbing  NEURO: alert & oriented x 3 with fluent speech, no focal motor/sensory deficits  LABORATORY DATA:  I have reviewed the data as listed   Chemistry      Component Value Date/Time   NA 135 01/23/2015 0810   NA 138 01/06/2015 1220   NA 137 02/07/2012 0734   K 4.3 01/23/2015 0810   K 4.3 01/06/2015 1220   K 4.7 02/07/2012 1409   CL 102 01/23/2015 0810   CL 104 02/07/2012 0734   CO2 26 01/23/2015 0810   CO2 24 01/06/2015 1220   CO2 26 02/07/2012 0734   BUN 27* 01/23/2015 0810   BUN 22.1 01/06/2015 1220   BUN 35* 02/07/2012 0734   CREATININE 0.92 01/23/2015 0810   CREATININE 1.2 01/06/2015 1220   CREATININE 1.11 02/07/2012 0734      Component Value Date/Time   CALCIUM 9.3 01/23/2015 0810   CALCIUM 9.5 01/06/2015 1220   CALCIUM 8.7 02/07/2012 0734   ALKPHOS 71 01/06/2015 1220   AST 26 01/06/2015 1220   ALT 25 01/06/2015 1220   BILITOT 1.27* 01/06/2015 1220        Lab Results  Component Value Date   WBC 10.1 01/30/2015   HGB 16.1 01/30/2015   HCT 47.6 01/30/2015   MCV 88.4 01/30/2015   PLT 161 01/30/2015   NEUTROABS 8.8* 01/30/2015   ASSESSMENT & PLAN:  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 Prior treatment:Temodar with radiation (with prophylactic Bactrim) started 09/30/2014 completed 11/08/2014 Repeat MRI 12/30/2014 infiltrative mass right occipital lobe extending to the right corpus callosum 4.7 cm with extensive vasogenic edema ------------------------------------------------------------------------------------------------------------------------------------------------------- Current treatment: Avastin every 10 mg/kg q2 weeks starting 01/30/2015 We will be monitoring him closely for toxicities especially hypertension and proteinuria  Severe anxiety: I have doubled the dosage of Xanax and decreased  the dosage of dexamethasone. Return to clinic in 2 weeks for follow-up and cycle 2 Avastin  Prognosis: I discussed with the patient that his prognosis is poor and that he needs to get his affairs in order. I will get palliative care consult. I described the natural progression of disease to where he would have decline in performance status to the point that he would not be able to do any activities of daily living. If he is unable to function, we will discontinue Avastin therapy and will place him in hospice care. Patient would like to donate his [facility] to [facility]. We will provide him with information regarding that.  Orders Placed This Encounter  Procedures  . Amb Referral to Palliative Care    Referral Priority:  Routine     Referral Type:  Consultation    Number of Visits Requested:  1   The patient has a good understanding of the overall plan. he agrees with it. he will call with any problems that may develop before the next visit here.   Rulon Eisenmenger, MD

## 2015-01-30 NOTE — Assessment & Plan Note (Signed)
Relapsed Right parietal lobe glioblastoma multiforme: Status post complete resection of a 3.4 cm peripherally enhancing mass within the right parieto-occipital lobe through a craniotomy at [place] on 07/29/2014 with the postoperative brain MRI showing no residual enhancement. Prior treatment:Temodar with radiation (with prophylactic Bactrim) started 09/30/2014 completed 11/08/2014 Repeat MRI 12/30/2014 infiltrative mass right occipital lobe extending to the right corpus callosum 4.7 cm with extensive vasogenic edema ------------------------------------------------------------------------------------------------------------------------------------------------------- Current treatment: Avastin every 10 mg/kg q2 weeks starting 01/30/2015 We will be monitoring him closely for toxicities especially hypertension and proteinuria  Severe anxiety: I have doubled the dosage of Xanax and decreased the dosage of dexamethasone. Return to clinic in 2 weeks for follow-up and cycle 2 Avastin

## 2015-01-30 NOTE — Patient Instructions (Signed)
[place] Discharge Instructions for Patients Receiving Chemotherapy  Today you received the following chemotherapy agents: Avastin.  To help prevent nausea and vomiting after your treatment, we encourage you to take your nausea medication: Zofran. Take one every 8 hours as needed. If you develop nausea and vomiting that is not controlled by your nausea medication, call the clinic.   BELOW ARE SYMPTOMS THAT SHOULD BE REPORTED IMMEDIATELY:  *FEVER GREATER THAN 100.5 F  *CHILLS WITH OR WITHOUT FEVER  NAUSEA AND VOMITING THAT IS NOT CONTROLLED WITH YOUR NAUSEA MEDICATION  *UNUSUAL SHORTNESS OF BREATH  *UNUSUAL BRUISING OR BLEEDING  TENDERNESS IN MOUTH AND THROAT WITH OR WITHOUT PRESENCE OF ULCERS  *URINARY PROBLEMS  *BOWEL PROBLEMS  UNUSUAL RASH Items with * indicate a potential emergency and should be followed up as soon as possible.  Feel free to call the clinic should you have any questions or concerns. The clinic phone number is [phone].  Please show the [place] at check-in to the Emergency Department and triage nurse.

## 2015-01-30 NOTE — Telephone Encounter (Signed)
Gave avs & calendar for August/September °

## 2015-02-13 ENCOUNTER — Ambulatory Visit (HOSPITAL_BASED_OUTPATIENT_CLINIC_OR_DEPARTMENT_OTHER): Payer: Medicare Other

## 2015-02-13 ENCOUNTER — Telehealth: Payer: Self-pay | Admitting: Hematology and Oncology

## 2015-02-13 ENCOUNTER — Ambulatory Visit (HOSPITAL_BASED_OUTPATIENT_CLINIC_OR_DEPARTMENT_OTHER): Payer: Medicare Other | Admitting: Hematology and Oncology

## 2015-02-13 ENCOUNTER — Other Ambulatory Visit: Payer: Medicare Other

## 2015-02-13 ENCOUNTER — Ambulatory Visit: Payer: Medicare Other

## 2015-02-13 ENCOUNTER — Encounter: Payer: Self-pay | Admitting: Hematology and Oncology

## 2015-02-13 VITALS — BP 141/82 | HR 53 | Temp 98.0°F

## 2015-02-13 DIAGNOSIS — C713 Malignant neoplasm of parietal lobe: Secondary | ICD-10-CM | POA: Diagnosis not present

## 2015-02-13 DIAGNOSIS — F419 Anxiety disorder, unspecified: Secondary | ICD-10-CM

## 2015-02-13 DIAGNOSIS — Z5112 Encounter for antineoplastic immunotherapy: Secondary | ICD-10-CM | POA: Diagnosis not present

## 2015-02-13 MED ORDER — SODIUM CHLORIDE 0.9 % IV SOLN
Freq: Once | INTRAVENOUS | Status: AC
  Administered 2015-02-13: 14:00:00 via INTRAVENOUS

## 2015-02-13 MED ORDER — OXYCODONE HCL 5 MG PO TABS: 5.0000 mg | ORAL_TABLET | ORAL | Status: DC | PRN

## 2015-02-13 MED ORDER — BEVACIZUMAB CHEMO INJECTION 400 MG/16ML
10.0000 mg/kg | Freq: Once | INTRAVENOUS | Status: AC
  Administered 2015-02-13: 1000 mg via INTRAVENOUS
  Filled 2015-02-13: qty 40

## 2015-02-13 MED ORDER — PROCHLORPERAZINE MALEATE 10 MG PO TABS: 10.0000 mg | ORAL_TABLET | Freq: Four times a day (QID) | ORAL | Status: DC | PRN

## 2015-02-13 MED ORDER — HEPARIN SOD (PORK) LOCK FLUSH 100 UNIT/ML IV SOLN
500.0000 [IU] | Freq: Once | INTRAVENOUS | Status: AC | PRN
  Administered 2015-02-13: 500 [IU]
  Filled 2015-02-13: qty 5

## 2015-02-13 MED ORDER — SODIUM CHLORIDE 0.9 % IJ SOLN
10.0000 mL | INTRAMUSCULAR | Status: DC | PRN
  Administered 2015-02-13: 10 mL
  Filled 2015-02-13: qty 10

## 2015-02-13 NOTE — Telephone Encounter (Signed)
Gave avs & calendar for August °

## 2015-02-13 NOTE — Assessment & Plan Note (Signed)
Relapsed Right parietal lobe glioblastoma multiforme: Status post complete resection of a 3.4 cm peripherally enhancing mass within the right parieto-occipital lobe through a craniotomy at [place] on 07/29/2014 with the postoperative brain MRI showing no residual enhancement. Prior treatment:Temodar with radiation (with prophylactic Bactrim) started 09/30/2014 completed 11/08/2014 Repeat MRI 12/30/2014 infiltrative mass right occipital lobe extending to the right corpus callosum 4.7 cm with extensive vasogenic edema ------------------------------------------------------------------------------------------------------------------------------------------------------- Current treatment: Avastin every 10 mg/kg q2 weeks started 01/30/2015 We will be monitoring him closely for toxicities especially hypertension and proteinuria  Severe anxiety: Improved with doubling the dosage of Xanax and decreasing the dosage of dexamethasone. Return to clinic in 2 weeks for follow-up and cycle 2 Avastin.  Return to clinic in 4 weeks for follow-up  And in 2 weeks for Avastin

## 2015-02-13 NOTE — Progress Notes (Signed)
Patient Care Team: Suzan Garibaldi, FNP as PCP - General (Nurse Practitioner)  DIAGNOSIS: No matching staging information was found for the patient.  SUMMARY OF ONCOLOGIC HISTORY:   Glioblastoma multiforme of parietal lobe   07/29/2014 Initial Diagnosis Glioblastoma multiforme of parietal lobe: Craniotomy and resection of the tumor at [facility] with no residual enhancement on postop MRI   09/30/2014 - 11/08/2014 Radiation Therapy Temodar with radiation adjuvant therapy   12/30/2014 Imaging Infiltrating mass right occipital lobe with central necrosis extending to the right corpus callosum 4.7 x 4.7 x 3.8 cm, subependymal region of the right lateral ventricle and to medial subarachnoid space and to resection margin, vasogenic edema   01/30/2015 -  Chemotherapy Avastin palliative treatment every 3 weeks    CHIEF COMPLIANT: Cycle 2 of Avastin  INTERVAL HISTORY: Ricky Montgomery is a 69 year old with above-mentioned history of recurrent glioblastoma multiform who was started on palliative treatment with Avastin 2 weeks ago. Is here today for cycle 2 of treatment. He reports no new problems or concerns. He did tolerate Avastin extremely well without any concerns. His only complaint today is hiccups that seemed last weeks altogether. It is keeping him up at night.   he feels that a few days after treatment his vision had improved a little bit.   REVIEW OF SYSTEMS:   Constitutional: Denies fevers, chills or abnormal weight loss Eyes: Denies blurriness of vision Ears, nose, mouth, throat, and face: Denies mucositis or sore throat Respiratory: Denies cough, dyspnea or wheezes Cardiovascular: Denies palpitation, chest discomfort or lower extremity swelling Gastrointestinal:  Hiccups Skin: Denies abnormal skin rashes Lymphatics: Denies new lymphadenopathy or easy bruising Neurological:Denies numbness, tingling or new weaknesses Behavioral/Psych: Anxiety, difficulty with sleep   All other systems were  reviewed with the patient and are negative.  I have reviewed the past medical history, past surgical history, social history and family history with the patient and they are unchanged from previous note.  ALLERGIES:  is allergic to prednisone.  MEDICATIONS:  Current Outpatient Prescriptions  Medication Sig Dispense Refill  . ALPRAZolam (XANAX) 0.5 MG tablet Take 0.5 mg by mouth 3 (three) times daily as needed for anxiety.     . ALPRAZolam (XANAX) 1 MG tablet Take 1 mg by mouth 3 (three) times daily as needed. For anxiety. Not yet started still using the 0.5mg .  2  . amLODipine (NORVASC) 10 MG tablet Take 1 tablet (10 mg total) by mouth daily. 30 tablet 8  . aspirin EC 81 MG tablet Take 81 mg by mouth daily.    . cetirizine (ZYRTEC) 10 MG tablet Take 10 mg by mouth daily as needed. For allergies    . dexamethasone (DECADRON) 4 MG tablet Take 1 tablet (4 mg total) by mouth 2 (two) times daily. (Patient taking differently: Take 2 mg by mouth 2 (two) times daily. ) 112 tablet 0  . emollient (BIAFINE) cream Apply 1 application topically daily as needed (itching). Apply to affected treated area daily starting  after irritation or when itching occurs    . levETIRAcetam (KEPPRA) 1000 MG tablet Take 1,000 mg by mouth 2 (two) times daily.  3  . lidocaine-prilocaine (EMLA) cream Apply 1 application topically as needed. 30 g 6  . Magnesium 250 MG TABS Take 750 mg by mouth daily.    . meclizine (ANTIVERT) 25 MG tablet Take 25 mg by mouth as needed for dizziness.    . ondansetron (ZOFRAN) 8 MG tablet Take 1 tablet (8 mg total) by  mouth 3 (three) times daily as needed for nausea or vomiting. 30 tablet 3  . oxyCODONE (OXY IR/ROXICODONE) 5 MG immediate release tablet Take 5 mg by mouth every 4 (four) hours as needed.    . sertraline (ZOLOFT) 100 MG tablet Take 100 mg by mouth daily. Not yet started, still taking 50mg .  2  . sertraline (ZOLOFT) 50 MG tablet Take 50 mg by mouth daily.     . traZODone (DESYREL)  50 MG tablet Take 50 mg by mouth at bedtime.      No current facility-administered medications for this visit.    PHYSICAL EXAMINATION: ECOG PERFORMANCE STATUS: 2 - Symptomatic, <50% confined to bed  There were no vitals filed for this visit. There were no vitals filed for this visit.  GENERAL:alert, no distress and comfortable SKIN: skin color, texture, turgor are normal, no rashes or significant lesions EYES: normal, Conjunctiva are pink and non-injected, sclera clear OROPHARYNX:no exudate, no erythema and lips, buccal mucosa, and tongue normal  NECK: supple, thyroid normal size, non-tender, without nodularity LYMPH:  no palpable lymphadenopathy in the cervical, axillary or inguinal LUNGS: clear to auscultation and percussion with normal breathing effort HEART: regular rate & rhythm and no murmurs and no lower extremity edema ABDOMEN:abdomen soft, non-tender and normal bowel sounds Musculoskeletal:no cyanosis of digits and no clubbing  NEURO: alert & oriented x 3 with fluent speech, no focal motor/sensory deficits  LABORATORY DATA:  I have reviewed the data as listed   Chemistry      Component Value Date/Time   NA 135 01/23/2015 0810   NA 138 01/06/2015 1220   NA 137 02/07/2012 0734   K 4.3 01/23/2015 0810   K 4.3 01/06/2015 1220   K 4.7 02/07/2012 1409   CL 102 01/23/2015 0810   CL 104 02/07/2012 0734   CO2 26 01/23/2015 0810   CO2 24 01/06/2015 1220   CO2 26 02/07/2012 0734   BUN 27* 01/23/2015 0810   BUN 22.1 01/06/2015 1220   BUN 35* 02/07/2012 0734   CREATININE 0.92 01/23/2015 0810   CREATININE 1.2 01/06/2015 1220   CREATININE 1.11 02/07/2012 0734      Component Value Date/Time   CALCIUM 9.3 01/23/2015 0810   CALCIUM 9.5 01/06/2015 1220   CALCIUM 8.7 02/07/2012 0734   ALKPHOS 71 01/06/2015 1220   AST 26 01/06/2015 1220   ALT 25 01/06/2015 1220   BILITOT 1.27* 01/06/2015 1220       Lab Results  Component Value Date   WBC 10.1 01/30/2015   HGB 16.1  01/30/2015   HCT 47.6 01/30/2015   MCV 88.4 01/30/2015   PLT 161 01/30/2015   NEUTROABS 8.8* 01/30/2015   ASSESSMENT & PLAN:  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 Prior treatment:Temodar with radiation (with prophylactic Bactrim) started 09/30/2014 completed 11/08/2014 Repeat MRI 12/30/2014 infiltrative mass right occipital lobe extending to the right corpus callosum 4.7 cm with extensive vasogenic edema ------------------------------------------------------------------------------------------------------------------------------------------------------- Current treatment: Avastin every 10 mg/kg q2 weeks started 01/30/2015 We will be monitoring him closely for toxicities especially hypertension and proteinuria  Severe hiccups: The last 6 weeks together. They're causing abdominal pain especially in the diaphragm. Is requesting a refill on his Percocets. I also prescribed him Compazine for hiccups. Severe anxiety: Improved  with doubling the dosage of Xanax and decreasing the dosage of dexamethasone.  Return to clinic in 4 weeks for follow-up  And in 2 weeks for Avastin   No orders of the defined types were placed in this encounter.   The patient has a good understanding of the overall plan. he agrees with it. he will call with any problems that may develop before the next visit here.   Rulon Eisenmenger, MD

## 2015-02-13 NOTE — Patient Instructions (Signed)
[facility] Discharge Instructions for Patients Receiving Chemotherapy  Today you received the following chemotherapy agents: avastin   To help prevent nausea and vomiting after your treatment, we encourage you to take your nausea medication.  Take it as often as prescribed.     If you develop nausea and vomiting that is not controlled by your nausea medication, call the clinic. If it is after clinic hours your family physician or the after hours number for the clinic or go to the Emergency Department.   BELOW ARE SYMPTOMS THAT SHOULD BE REPORTED IMMEDIATELY:  *FEVER GREATER THAN 100.5 F  *CHILLS WITH OR WITHOUT FEVER  NAUSEA AND VOMITING THAT IS NOT CONTROLLED WITH YOUR NAUSEA MEDICATION  *UNUSUAL SHORTNESS OF BREATH  *UNUSUAL BRUISING OR BLEEDING  TENDERNESS IN MOUTH AND THROAT WITH OR WITHOUT PRESENCE OF ULCERS  *URINARY PROBLEMS  *BOWEL PROBLEMS  UNUSUAL RASH Items with * indicate a potential emergency and should be followed up as soon as possible.  Feel free to call the clinic you have any questions or concerns. The clinic phone number is [phone].   I have been informed and understand all the instructions given to me. I know to contact the clinic, my physician, or go to the Emergency Department if any problems should occur. I do not have any questions at this time, but understand that I may call the clinic during office hours   should I have any questions or need assistance in obtaining follow up care.    __________________________________________  _____________  __________ Signature of Patient or Authorized Representative            Date                   Time    __________________________________________ Nurse's Signature    

## 2015-02-13 NOTE — Progress Notes (Signed)
Patient's CBC tube clotted.  Per Dr Lindi Adie, ok to treat without CBC today.

## 2015-02-18 ENCOUNTER — Ambulatory Visit: Payer: Medicare Other | Admitting: Diagnostic Neuroimaging

## 2015-02-27 ENCOUNTER — Other Ambulatory Visit (HOSPITAL_BASED_OUTPATIENT_CLINIC_OR_DEPARTMENT_OTHER): Payer: Medicare Other

## 2015-02-27 ENCOUNTER — Ambulatory Visit (HOSPITAL_BASED_OUTPATIENT_CLINIC_OR_DEPARTMENT_OTHER): Payer: Medicare Other

## 2015-02-27 VITALS — BP 124/76 | HR 69 | Temp 97.0°F | Resp 20

## 2015-02-27 DIAGNOSIS — Z5112 Encounter for antineoplastic immunotherapy: Secondary | ICD-10-CM | POA: Diagnosis not present

## 2015-02-27 DIAGNOSIS — C713 Malignant neoplasm of parietal lobe: Secondary | ICD-10-CM

## 2015-02-27 LAB — CBC WITH DIFFERENTIAL/PLATELET
BASO%: 0.1 % (ref 0.0–2.0)
BASOS ABS: 0 10*3/uL (ref 0.0–0.1)
EOS%: 0.1 % (ref 0.0–7.0)
Eosinophils Absolute: 0 10*3/uL (ref 0.0–0.5)
HEMATOCRIT: 43.9 % (ref 38.4–49.9)
HGB: 15.8 g/dL (ref 13.0–17.1)
LYMPH#: 1.1 10*3/uL (ref 0.9–3.3)
LYMPH%: 14.7 % (ref 14.0–49.0)
MCH: 30.8 pg (ref 27.2–33.4)
MCHC: 36 g/dL (ref 32.0–36.0)
MCV: 85.6 fL (ref 79.3–98.0)
MONO#: 0.6 10*3/uL (ref 0.1–0.9)
MONO%: 7.3 % (ref 0.0–14.0)
NEUT#: 5.9 10*3/uL (ref 1.5–6.5)
NEUT%: 77.8 % — ABNORMAL HIGH (ref 39.0–75.0)
Platelets: 103 10*3/uL — ABNORMAL LOW (ref 140–400)
RBC: 5.13 10*6/uL (ref 4.20–5.82)
RDW: 13.6 % (ref 11.0–14.6)
WBC: 7.6 10*3/uL (ref 4.0–10.3)

## 2015-02-27 LAB — TECHNOLOGIST REVIEW

## 2015-02-27 MED ORDER — SODIUM CHLORIDE 0.9 % IV SOLN
10.0000 mg/kg | Freq: Once | INTRAVENOUS | Status: AC
  Administered 2015-02-27: 1000 mg via INTRAVENOUS
  Filled 2015-02-27: qty 40

## 2015-02-27 MED ORDER — SODIUM CHLORIDE 0.9 % IV SOLN
Freq: Once | INTRAVENOUS | Status: AC
  Administered 2015-02-27: 10:00:00 via INTRAVENOUS

## 2015-02-27 MED ORDER — SODIUM CHLORIDE 0.9 % IJ SOLN
10.0000 mL | INTRAMUSCULAR | Status: DC | PRN
  Administered 2015-02-27: 10 mL
  Filled 2015-02-27: qty 10

## 2015-02-27 MED ORDER — HEPARIN SOD (PORK) LOCK FLUSH 100 UNIT/ML IV SOLN
500.0000 [IU] | Freq: Once | INTRAVENOUS | Status: AC | PRN
  Administered 2015-02-27: 500 [IU]
  Filled 2015-02-27: qty 5

## 2015-02-27 NOTE — Patient Instructions (Signed)
[place] Discharge Instructions for Patients Receiving Chemotherapy  Today you received the following chemotherapy agents: Avastin.  To help prevent nausea and vomiting after your treatment, we encourage you to take your nausea medication: Compazine. Take one every 6 hours as needed.    If you develop nausea and vomiting that is not controlled by your nausea medication, call the clinic.  Begin Senokot-S twice daily for constipation.  BELOW ARE SYMPTOMS THAT SHOULD BE REPORTED IMMEDIATELY:  *FEVER GREATER THAN 100.5 F  *CHILLS WITH OR WITHOUT FEVER  NAUSEA AND VOMITING THAT IS NOT CONTROLLED WITH YOUR NAUSEA MEDICATION  *UNUSUAL SHORTNESS OF BREATH  *UNUSUAL BRUISING OR BLEEDING  TENDERNESS IN MOUTH AND THROAT WITH OR WITHOUT PRESENCE OF ULCERS  *URINARY PROBLEMS  *BOWEL PROBLEMS  UNUSUAL RASH Items with * indicate a potential emergency and should be followed up as soon as possible.  Feel free to call the clinic should you have any questions or concerns. The clinic phone number is [phone].  Please show the [place] at check-in to the Emergency Department and triage nurse.

## 2015-03-03 ENCOUNTER — Other Ambulatory Visit: Payer: Self-pay | Admitting: Hematology and Oncology

## 2015-03-03 NOTE — Telephone Encounter (Signed)
Active treatment  

## 2015-03-04 ENCOUNTER — Ambulatory Visit: Payer: Medicare Other | Admitting: Diagnostic Neuroimaging

## 2015-03-13 ENCOUNTER — Other Ambulatory Visit (HOSPITAL_BASED_OUTPATIENT_CLINIC_OR_DEPARTMENT_OTHER): Payer: Medicare Other

## 2015-03-13 ENCOUNTER — Encounter: Payer: Self-pay | Admitting: Hematology and Oncology

## 2015-03-13 ENCOUNTER — Other Ambulatory Visit: Payer: Self-pay | Admitting: Hematology and Oncology

## 2015-03-13 ENCOUNTER — Telehealth: Payer: Self-pay | Admitting: *Deleted

## 2015-03-13 ENCOUNTER — Ambulatory Visit: Payer: Medicare Other

## 2015-03-13 ENCOUNTER — Telehealth: Payer: Self-pay | Admitting: Hematology and Oncology

## 2015-03-13 ENCOUNTER — Ambulatory Visit (HOSPITAL_BASED_OUTPATIENT_CLINIC_OR_DEPARTMENT_OTHER): Payer: Medicare Other | Admitting: Hematology and Oncology

## 2015-03-13 VITALS — BP 128/72 | HR 68 | Temp 97.5°F | Resp 18 | Ht 74.0 in | Wt 218.4 lb

## 2015-03-13 DIAGNOSIS — Z79899 Other long term (current) drug therapy: Secondary | ICD-10-CM | POA: Diagnosis not present

## 2015-03-13 DIAGNOSIS — C713 Malignant neoplasm of parietal lobe: Secondary | ICD-10-CM | POA: Diagnosis not present

## 2015-03-13 LAB — CBC WITH DIFFERENTIAL/PLATELET
BASO%: 0.2 % (ref 0.0–2.0)
Basophils Absolute: 0 10*3/uL (ref 0.0–0.1)
EOS%: 0 % (ref 0.0–7.0)
Eosinophils Absolute: 0 10*3/uL (ref 0.0–0.5)
HCT: 42.8 % (ref 38.4–49.9)
HGB: 15.4 g/dL (ref 13.0–17.1)
LYMPH%: 16.4 % (ref 14.0–49.0)
MCH: 30.7 pg (ref 27.2–33.4)
MCHC: 36 g/dL (ref 32.0–36.0)
MCV: 85.3 fL (ref 79.3–98.0)
MONO#: 0.5 10*3/uL (ref 0.1–0.9)
MONO%: 7.8 % (ref 0.0–14.0)
NEUT#: 4.7 10*3/uL (ref 1.5–6.5)
NEUT%: 75.6 % — AB (ref 39.0–75.0)
PLATELETS: 96 10*3/uL — AB (ref 140–400)
RBC: 5.02 10*6/uL (ref 4.20–5.82)
RDW: 13.9 % (ref 11.0–14.6)
WBC: 6.2 10*3/uL (ref 4.0–10.3)
lymph#: 1 10*3/uL (ref 0.9–3.3)

## 2015-03-13 LAB — TECHNOLOGIST REVIEW

## 2015-03-13 LAB — UA PROTEIN, DIPSTICK - CHCC: PROTEIN: NEGATIVE mg/dL

## 2015-03-13 MED ORDER — BACLOFEN 10 MG PO TABS: 10.0000 mg | ORAL_TABLET | Freq: Three times a day (TID) | ORAL | Status: AC

## 2015-03-13 MED ORDER — CLONAZEPAM 1 MG PO TABS: 1.0000 mg | ORAL_TABLET | Freq: Two times a day (BID) | ORAL | Status: AC

## 2015-03-13 MED ORDER — OXYCODONE HCL 5 MG PO TABS: 5.0000 mg | ORAL_TABLET | ORAL | Status: AC | PRN

## 2015-03-13 MED ORDER — BACLOFEN 10 MG PO TABS: 10.0000 mg | ORAL_TABLET | Freq: Three times a day (TID) | ORAL | Status: DC

## 2015-03-13 NOTE — Progress Notes (Signed)
Patient Care Team: Suzan Garibaldi, FNP as PCP - General (Nurse Practitioner)  DIAGNOSIS: No matching staging information was found for the patient.  SUMMARY OF ONCOLOGIC HISTORY:   Glioblastoma multiforme of parietal lobe   07/29/2014 Initial Diagnosis Glioblastoma multiforme of parietal lobe: Craniotomy and resection of the tumor at [facility] with no residual enhancement on postop MRI   09/30/2014 - 11/08/2014 Radiation Therapy Temodar with radiation adjuvant therapy   12/30/2014 Imaging Infiltrating mass right occipital lobe with central necrosis extending to the right corpus callosum 4.7 x 4.7 x 3.8 cm, subependymal region of the right lateral ventricle and to medial subarachnoid space and to resection margin, vasogenic edema   01/30/2015 -  Chemotherapy Avastin palliative treatment every 3 weeks    CHIEF COMPLIANT: Stopping chemotherapy due to declining performance status  INTERVAL HISTORY: Ricky Montgomery is a 69 year old gentleman with above-mentioned history of glioblastoma multiform a relapsed disease who is currently on second line treatment with Avastin. Unfortunately he has been not feeling well for the past 3 months. He has profound hiccups which lead to intense abdominal pain. This is limited his ability to do any activities or eat food or enjoy life. Today he tells me that he is done and would like to not do any more treatments. However he would like to know what his cancer is doing. He would like to have an MRI of the brain to find out some more. He could not tolerate Compazine because it caused tachycardia. He uses oxycodone which appears to help him somewhat with the pain related to the hiccups.  REVIEW OF SYSTEMS:   Constitutional: Denies fevers, chills or abnormal weight loss Eyes: Denies blurriness of vision Ears, nose, mouth, throat, and face: Denies mucositis or sore throat Respiratory: Denies cough, dyspnea or wheezes Cardiovascular: Denies palpitation, chest discomfort or  lower extremity swelling Gastrointestinal:  Severe hiccups, decreased appetite weakness Skin: Denies abnormal skin rashes Lymphatics: Denies new lymphadenopathy or easy bruising Neurological: Profound lower extremity weakness Behavioral/Psych: Mood is stable, no new changes  All other systems were reviewed with the patient and are negative.  I have reviewed the past medical history, past surgical history, social history and family history with the patient and they are unchanged from previous note.  ALLERGIES:  is allergic to prednisone.  MEDICATIONS:  Current Outpatient Prescriptions  Medication Sig Dispense Refill  . amLODipine (NORVASC) 10 MG tablet Take 1 tablet (10 mg total) by mouth daily. 30 tablet 8  . aspirin EC 81 MG tablet Take 81 mg by mouth daily.    . cetirizine (ZYRTEC) 10 MG tablet Take 10 mg by mouth daily as needed. For allergies    . dexamethasone (DECADRON) 4 MG tablet Take 1 tablet (4 mg total) by mouth 2 (two) times daily. (Patient taking differently: Take 2 mg by mouth 2 (two) times daily. ) 112 tablet 0  . levETIRAcetam (KEPPRA) 1000 MG tablet Take 1,000 mg by mouth 2 (two) times daily.  3  . lidocaine-prilocaine (EMLA) cream Apply 1 application topically as needed. 30 g 6  . Magnesium 250 MG TABS Take 750 mg by mouth daily.    . meclizine (ANTIVERT) 25 MG tablet Take 25 mg by mouth as needed for dizziness.    . ondansetron (ZOFRAN) 8 MG tablet TAKE 1 TABLET BY MOUTH 3 TIMES DAILY AS NEEDED FOR NAUSEA OR VOMITING. 30 tablet 3  . oxyCODONE (OXY IR/ROXICODONE) 5 MG immediate release tablet Take 1 tablet (5 mg total) by mouth  every 4 (four) hours as needed. 120 tablet 0  . sennosides-docusate sodium (SENOKOT-S) 8.6-50 MG tablet Take 1 tablet by mouth 2 (two) times daily.     . clonazePAM (KLONOPIN) 1 MG tablet Take 1 tablet (1 mg total) by mouth 2 (two) times daily. 60 tablet 0   Current Facility-Administered Medications  Medication Dose Route Frequency Provider Last  Rate Last Dose  . baclofen (LIORESAL) tablet 10 mg  10 mg Oral TID Nicholas Lose, MD        PHYSICAL EXAMINATION: ECOG PERFORMANCE STATUS: 3 - Symptomatic, >50% confined to bed  Filed Vitals:   03/13/15 0938  BP: 128/72  Pulse: 68  Temp: 97.5 F (36.4 C)  Resp: 18   Filed Weights   03/13/15 0938  Weight: 218 lb 6.4 oz (99.066 kg)    GENERAL:alert, no distress and comfortable SKIN: skin color, texture, turgor are normal, no rashes or significant lesions EYES: normal, Conjunctiva are pink and non-injected, sclera clear OROPHARYNX:no exudate, no erythema and lips, buccal mucosa, and tongue normal  NECK: supple, thyroid normal size, non-tender, without nodularity LYMPH:  no palpable lymphadenopathy in the cervical, axillary or inguinal LUNGS: clear to auscultation and percussion with normal breathing effort HEART: regular rate & rhythm and no murmurs and no lower extremity edema ABDOMEN: Tenderness, moderate distention, constipation Musculoskeletal:no cyanosis of digits and no clubbing  NEURO: alert & oriented x 3 with fluent speech, profound lower extremity weakness strength 3/5  LABORATORY DATA:  I have reviewed the data as listed   Chemistry      Component Value Date/Time   NA 135 01/23/2015 0810   NA 138 01/06/2015 1220   NA 137 02/07/2012 0734   K 4.3 01/23/2015 0810   K 4.3 01/06/2015 1220   K 4.7 02/07/2012 1409   CL 102 01/23/2015 0810   CL 104 02/07/2012 0734   CO2 26 01/23/2015 0810   CO2 24 01/06/2015 1220   CO2 26 02/07/2012 0734   BUN 27* 01/23/2015 0810   BUN 22.1 01/06/2015 1220   BUN 35* 02/07/2012 0734   CREATININE 0.92 01/23/2015 0810   CREATININE 1.2 01/06/2015 1220   CREATININE 1.11 02/07/2012 0734      Component Value Date/Time   CALCIUM 9.3 01/23/2015 0810   CALCIUM 9.5 01/06/2015 1220   CALCIUM 8.7 02/07/2012 0734   ALKPHOS 71 01/06/2015 1220   AST 26 01/06/2015 1220   ALT 25 01/06/2015 1220   BILITOT 1.27* 01/06/2015 1220       Lab  Results  Component Value Date   WBC 6.2 03/13/2015   HGB 15.4 03/13/2015   HCT 42.8 03/13/2015   MCV 85.3 03/13/2015   PLT 96* 03/13/2015   NEUTROABS 4.7 03/13/2015   ASSESSMENT & PLAN:  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 Prior treatment:Temodar with radiation (with prophylactic Bactrim) started 09/30/2014 completed 11/08/2014 Repeat MRI 12/30/2014 infiltrative mass right occipital lobe extending to the right corpus callosum 4.7 cm with extensive vasogenic edema ------------------------------------------------------------------------------------------------------------------------------------------------------- Current treatment: Avastin every 10 mg/kg q2 weeks started 01/30/2015, discontinuing therapy because of declining performance status  Severe hiccups: On pain medications. I will prescribe baclofen for the spasms. Severe anxiety: We will switch him from Xanax to [place] to repeat MRI of the brain  in 1 week. Patient does not want to continue with Avastin therapy no matter what the MRI shows. I will consult hospice care to assist him with end-of-life care needs. I will call and discuss MRI results on the phone.  We will not make any follow-up appointments. I anticipate that he has very short life expectancy.   Orders Placed This Encounter  Procedures  . MR Brain W Wo Contrast    Standing Status: Future     Number of Occurrences:      Standing Expiration Date: 03/12/2016    Order Specific Question:  Reason for Exam (SYMPTOM  OR DIAGNOSIS REQUIRED)    Answer:  [facility] on avastin evaluation    Order Specific Question:  Preferred imaging location?    Answer:  [facility]    Order Specific Question:  Does the patient have a pacemaker or implanted devices?    Answer:  No    Order Specific Question:  What is the patient's sedation requirement?    Answer:  No Sedation   The patient has a good understanding of the overall plan. he agrees with it. he will call with any problems that may develop before the next visit here.   Ricky Eisenmenger, MD

## 2015-03-13 NOTE — Telephone Encounter (Signed)
Advised diana of hospice ref. And the hospital will call with his mri

## 2015-03-13 NOTE — Telephone Encounter (Signed)
Called referral to [facility] and Palliative [facility].

## 2015-03-13 NOTE — Assessment & Plan Note (Signed)
Relapsed Right parietal lobe glioblastoma multiforme: Status post complete resection of a 3.4 cm peripherally enhancing mass within the right parieto-occipital lobe through a craniotomy at [place] on 07/29/2014 with the postoperative brain MRI showing no residual enhancement. Prior treatment:Temodar with radiation (with prophylactic Bactrim) started 09/30/2014 completed 11/08/2014 Repeat MRI 12/30/2014 infiltrative mass right occipital lobe extending to the right corpus callosum 4.7 cm with extensive vasogenic edema ------------------------------------------------------------------------------------------------------------------------------------------------------- Current treatment: Avastin every 10 mg/kg q2 weeks started 01/30/2015 We will be monitoring him closely for toxicities especially hypertension and proteinuria  Severe hiccups: On Compazine for hiccups. Severe anxiety: Improved with doubling the dosage of Xanax and decreasing the dosage of dexamethasone.  Return to clinic in 4 weeks for follow-up And in 2 weeks for Avastin Plan to repeat MRI of the brain in 1 month in follow-up.

## 2015-03-24 ENCOUNTER — Telehealth: Payer: Self-pay

## 2015-03-24 NOTE — Telephone Encounter (Signed)
Call report received from [place] 03-24-2015 4:45 pm notifying that patient passed away 2022-10-18 morning Mar 24, 2023.   Routed to HIM.  MD notified.

## 2015-03-25 ENCOUNTER — Telehealth: Payer: Self-pay

## 2015-03-25 NOTE — Telephone Encounter (Signed)
Notes rcvd from hospice dtd 03/20/15.  Reviewed by Dr. Lindi Adie.  Sent to scan. Signed orders returned to hospice.  Sent to scan.

## 2015-03-27 ENCOUNTER — Ambulatory Visit: Payer: Medicare Other

## 2015-03-27 ENCOUNTER — Other Ambulatory Visit: Payer: Medicare Other

## 2015-04-10 ENCOUNTER — Ambulatory Visit: Payer: Medicare Other

## 2015-04-10 ENCOUNTER — Other Ambulatory Visit: Payer: Medicare Other

## 2015-04-12 DEATH — deceased

## 2015-11-28 IMAGING — CR DG HUMERUS 2V *R*
4 series · 4 of 4 positions shown · non-contrast
Comparison: None.

CLINICAL DATA: Fall, mid humerus abrasion, posterior humerus pain

EXAM:
RIGHT HUMERUS - 2+ VIEW

[t humerus ap right (1 of 2)]
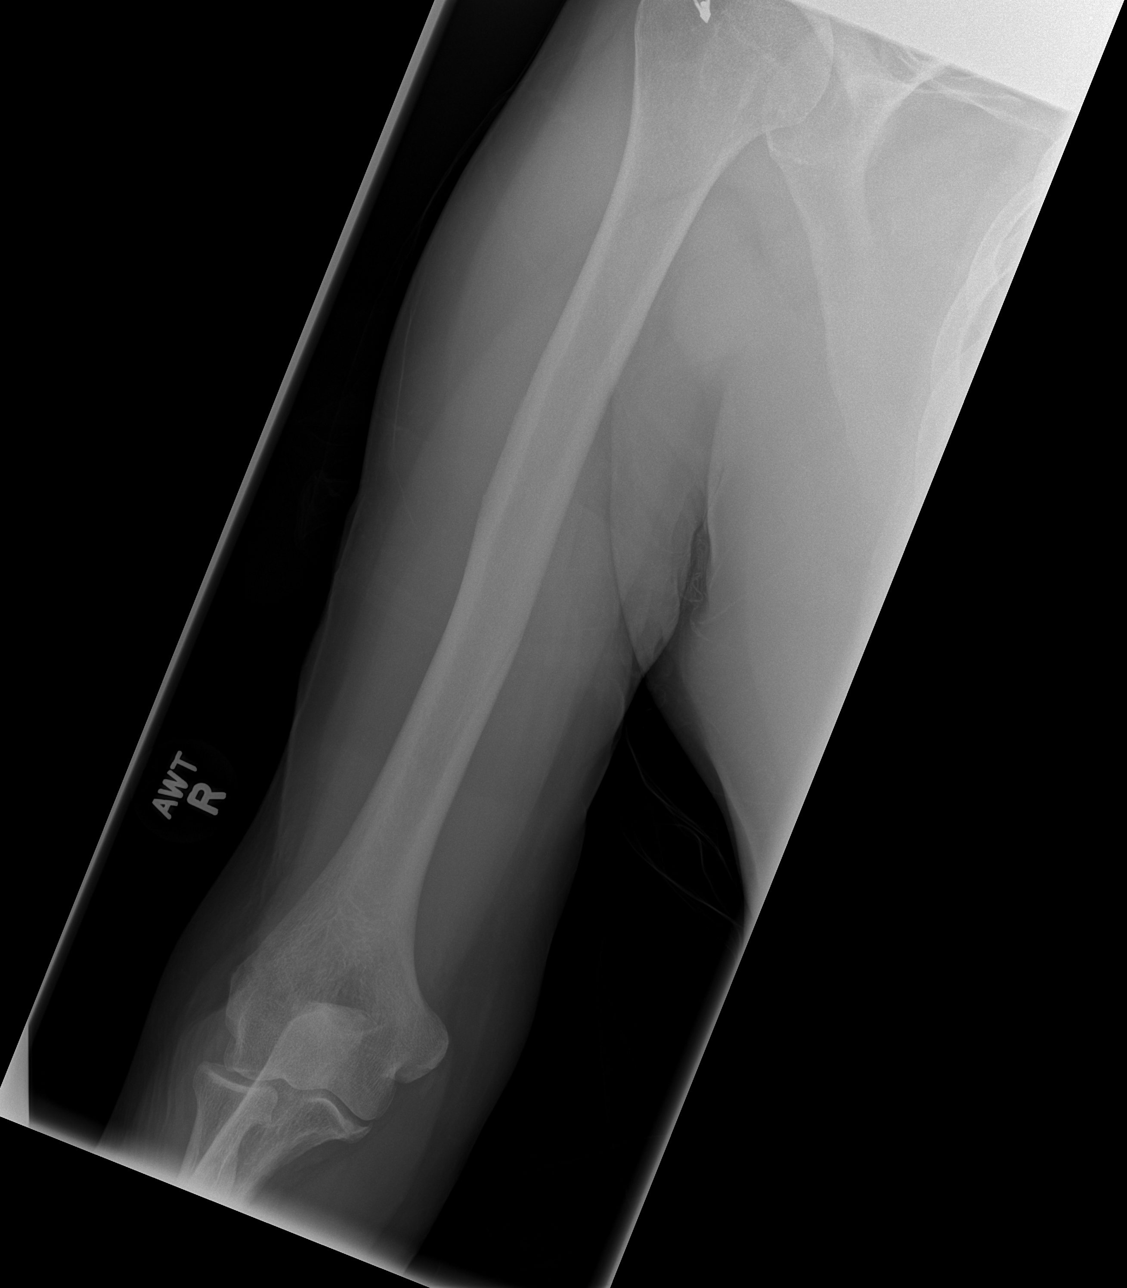

[t humerus ap right (2 of 2)]
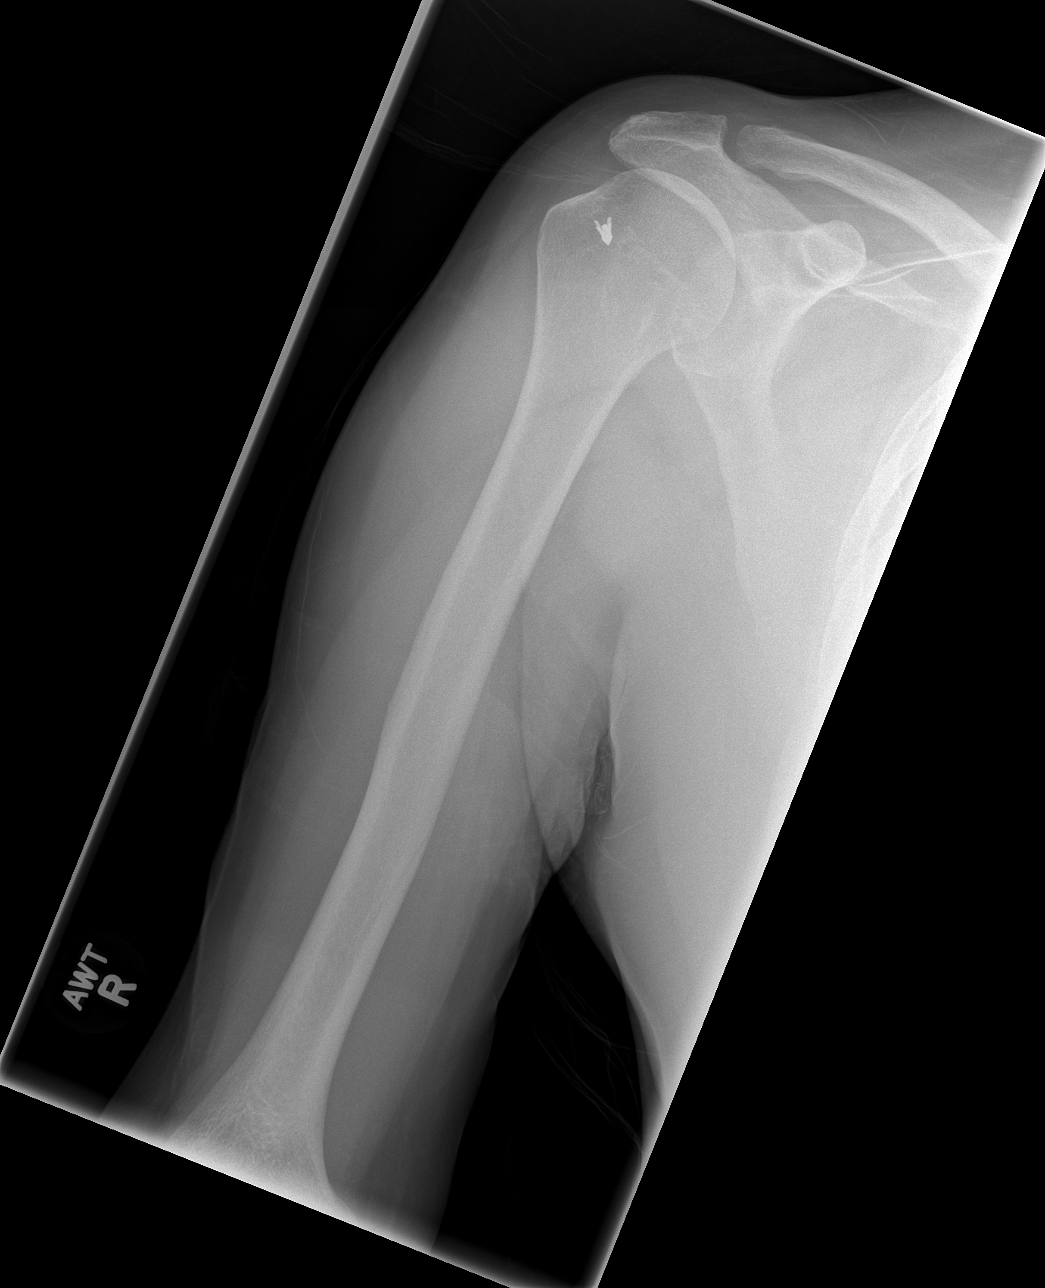

[t humerus lat right (1 of 2)]
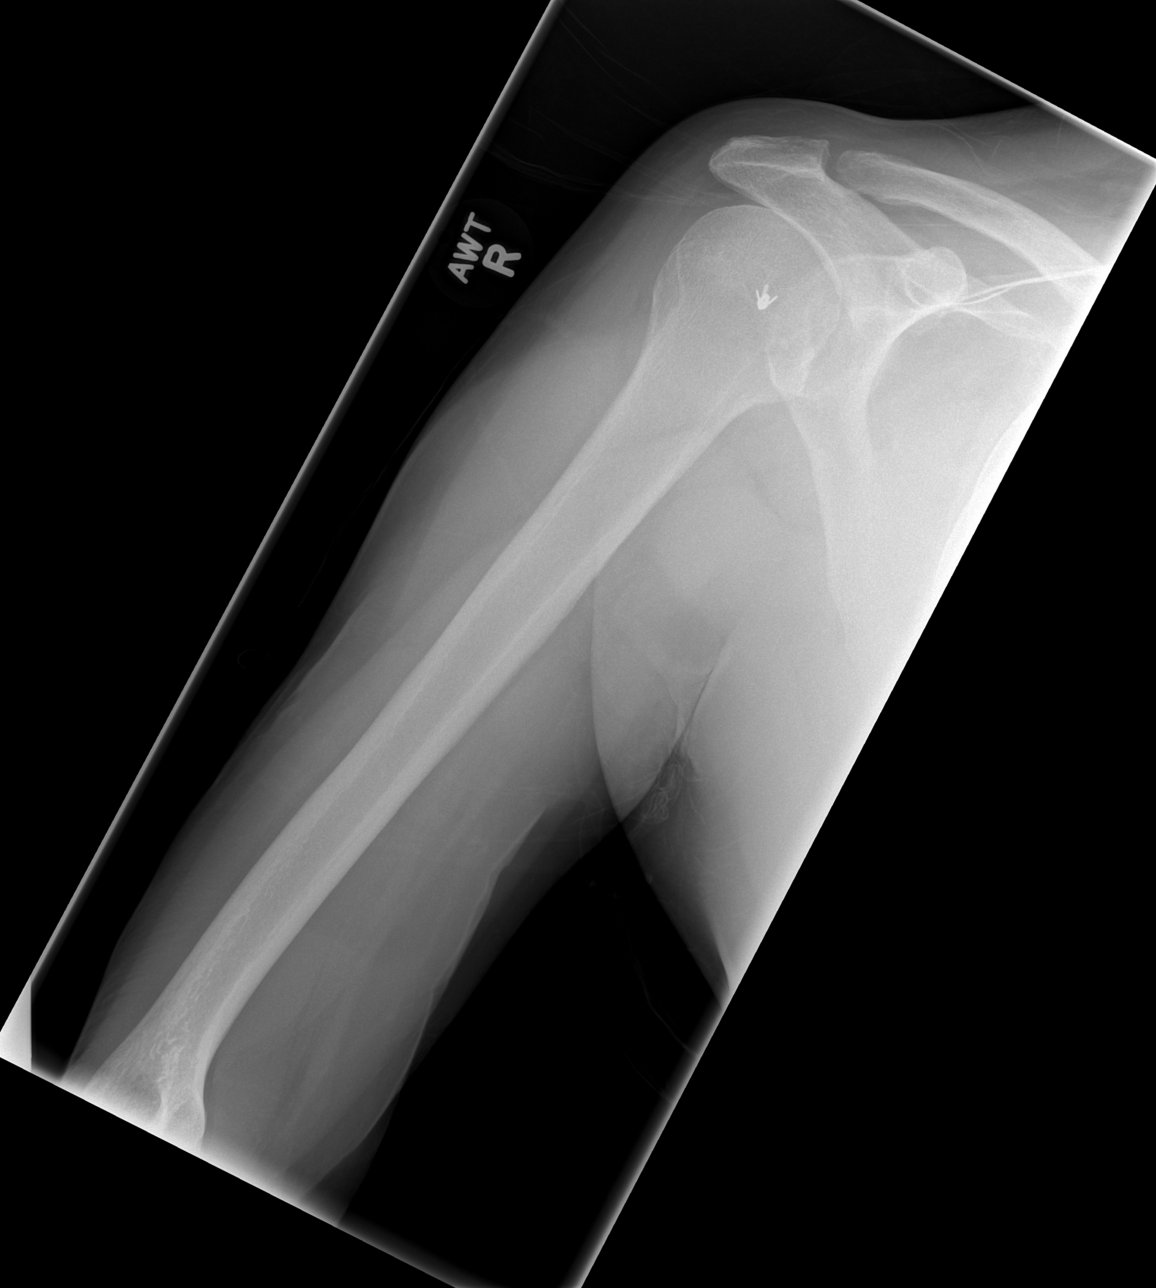

[t humerus lat right (2 of 2)]
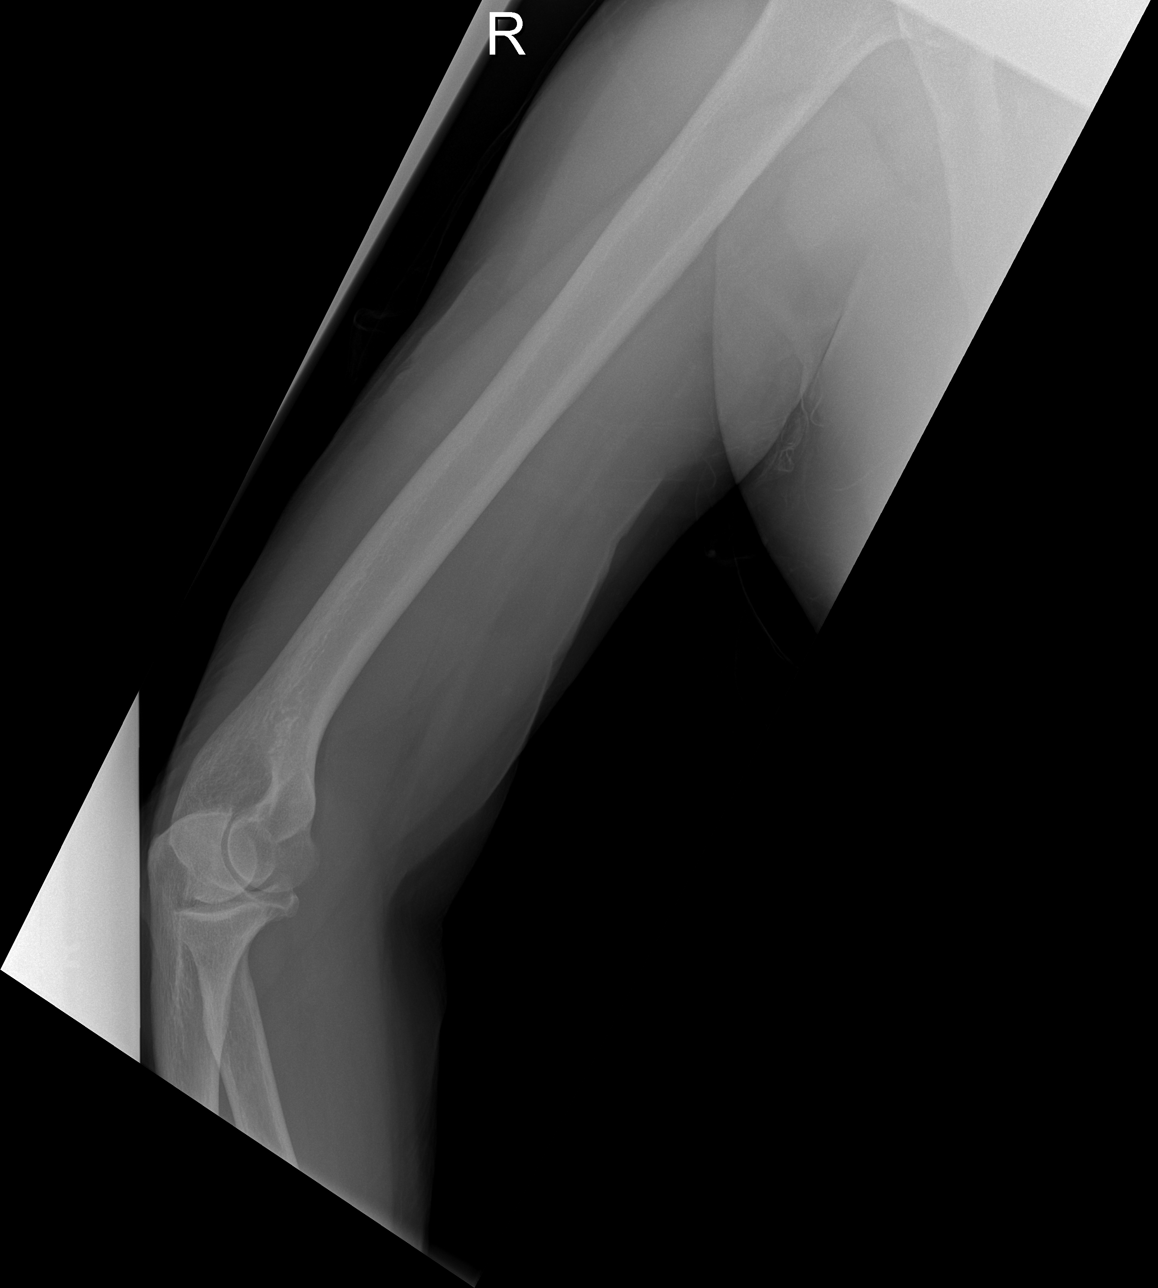

[4 of 4 positions shown; findings below may reference images not displayed]

FINDINGS: Four views of the right humerus submitted. No acute fracture or
subluxation. Postsurgical changes with prior metallic anchor is
noted in right humeral head.
IMPRESSION: No acute fracture or subluxation. Metallic anchor is noted in right
humeral head.

## 2015-12-15 IMAGING — XA IR US GUIDE VASC ACCESS RIGHT
1 series · 1 of 1 positions shown · non-contrast
Comparison: none

CLINICAL DATA: 69-year-old male with a glioblastoma multiforme who
requires durable venous access for Avastin infusions.
TECHNIQUE: The right neck and chest was prepped with chlorhexidine, and draped
in the usual sterile fashion using maximum barrier technique (cap
and mask, sterile gown, sterile gloves, large sterile sheet, hand
hygiene and cutaneous antiseptic). Antibiotic prophylaxis was
provided with 2 g Ancef administered IV one hour prior to skin
incision. Local anesthesia was attained by infiltration with 1%
lidocaine with epinephrine.

[Series 300: line placements · 1 of 1 slices shown]
[im 1/1]
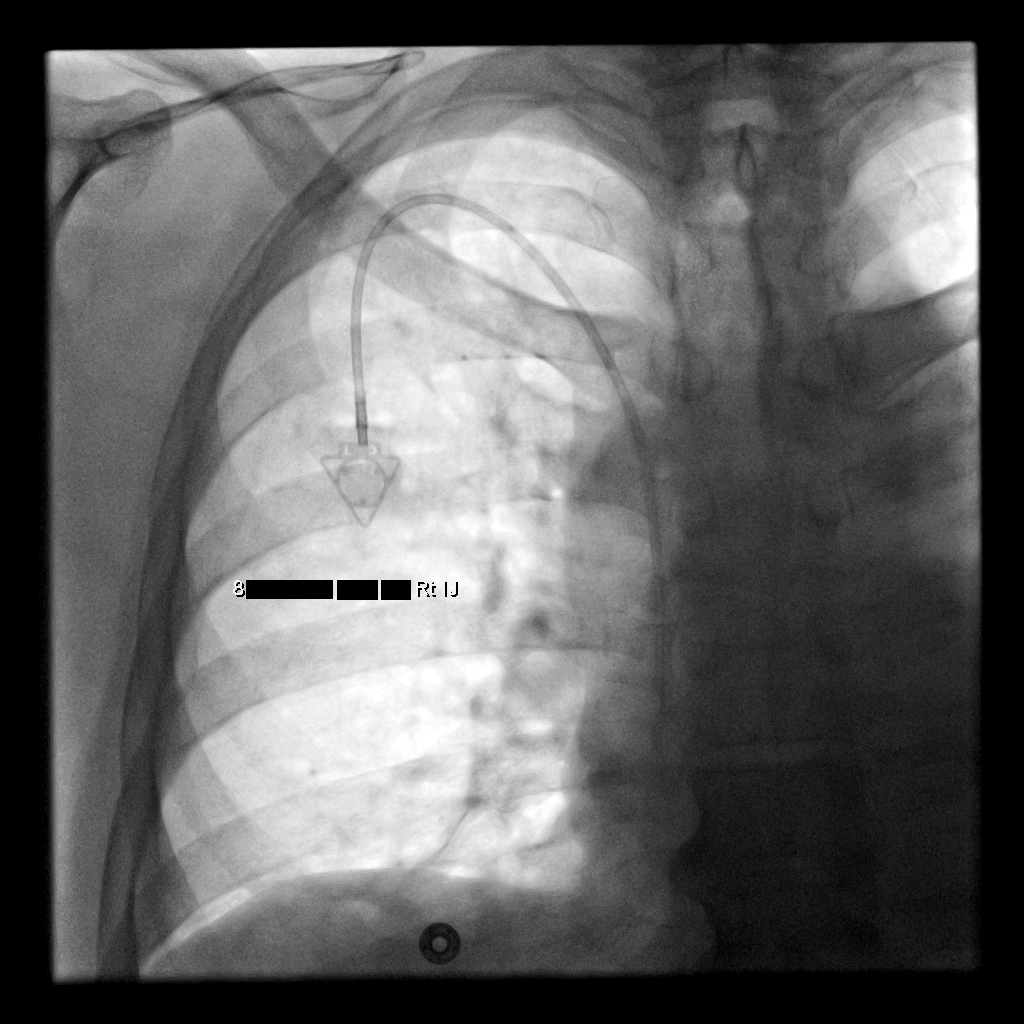

[1 of 1 positions shown; findings below may reference images not displayed]

EXAM:
IR RIGHT FLOURO GUIDE CV LINE; IR ULTRASOUND GUIDANCE VASC ACCESS
RIGHT

Date: 01/23/2015

ANESTHESIA/SEDATION:
Moderate (conscious) sedation was administered during this
procedure. A total of 1 mg Versed and 50 mg Fentanyl were
administered intravenously. The patient's vital signs were monitored
continuously by radiology nursing throughout the course of the
procedure.

Total sedation time: 18 minutes

FLUOROSCOPY TIME:  6 seconds

1 mGy
Ultrasound demonstrated patency of the right internal jugular vein,
and this was documented with an image. Under real-time ultrasound
guidance, this vein was accessed with a 21 gauge micropuncture
needle and image documentation was performed. A small dermatotomy
was made at the access site with an 11 scalpel. A 0.018" wire was
advanced into the SVC and the access needle exchanged for a 4F
micropuncture vascular sheath. The 0.018" wire was then removed and
a 0.035" wire advanced into the IVC.





The pocket was then closed in two layers using first subdermal
inverted interrupted absorbable sutures followed by a running
subcuticular suture. The epidermis was then sealed with Dermabond.
The dermatotomy at the venous access site was also closed with a
single inverted subdermal suture and the epidermis sealed with
Dermabond.

COMPLICATIONS:
None.  The patient tolerated the procedure well.
IMPRESSION: Successful placement of a right IJ approach Power Port with
ultrasound and fluoroscopic guidance. The catheter is ready for use.
